# Patient Record
Sex: Female | Born: 1955
Health system: Southern US, Community
[De-identification: ages and names within clinical notes are randomized; demographics above are authoritative.]

## PROBLEM LIST (undated history)

## (undated) DIAGNOSIS — T4145XA Adverse effect of unspecified anesthetic, initial encounter: Secondary | ICD-10-CM

## (undated) DIAGNOSIS — K5909 Other constipation: Secondary | ICD-10-CM

## (undated) DIAGNOSIS — Z9889 Other specified postprocedural states: Secondary | ICD-10-CM

## (undated) DIAGNOSIS — F419 Anxiety disorder, unspecified: Secondary | ICD-10-CM

## (undated) DIAGNOSIS — M1990 Unspecified osteoarthritis, unspecified site: Secondary | ICD-10-CM

## (undated) DIAGNOSIS — T8859XA Other complications of anesthesia, initial encounter: Secondary | ICD-10-CM

## (undated) DIAGNOSIS — K635 Polyp of colon: Secondary | ICD-10-CM

## (undated) DIAGNOSIS — E785 Hyperlipidemia, unspecified: Secondary | ICD-10-CM

## (undated) DIAGNOSIS — F32A Depression, unspecified: Secondary | ICD-10-CM

## (undated) DIAGNOSIS — I10 Essential (primary) hypertension: Secondary | ICD-10-CM

## (undated) DIAGNOSIS — R112 Nausea with vomiting, unspecified: Secondary | ICD-10-CM

## (undated) DIAGNOSIS — K219 Gastro-esophageal reflux disease without esophagitis: Secondary | ICD-10-CM

## (undated) DIAGNOSIS — F329 Major depressive disorder, single episode, unspecified: Secondary | ICD-10-CM

## (undated) HISTORY — PX: BREAST SURGERY: SHX581

## (undated) HISTORY — DX: Major depressive disorder, single episode, unspecified: F32.9

## (undated) HISTORY — DX: Hyperlipidemia, unspecified: E78.5

## (undated) HISTORY — PX: HIP SURGERY: SHX245

## (undated) HISTORY — PX: RHINOPLASTY: SHX2354

## (undated) HISTORY — DX: Depression, unspecified: F32.A

## (undated) HISTORY — PX: OVARIAN CYST SURGERY: SHX726

## (undated) HISTORY — DX: Unspecified osteoarthritis, unspecified site: M19.90

## (undated) HISTORY — DX: Essential (primary) hypertension: I10

## (undated) HISTORY — DX: Other constipation: K59.09

## (undated) HISTORY — DX: Anxiety disorder, unspecified: F41.9

## (undated) HISTORY — DX: Gastro-esophageal reflux disease without esophagitis: K21.9

## (undated) HISTORY — DX: Polyp of colon: K63.5

---

## 2013-05-21 LAB — LIPID PANEL
CHOLESTEROL: 243 mg/dL — AB (ref 0–200)
HDL: 67 mg/dL (ref 35–70)
LDL CALC: 160 mg/dL
LDL/HDL RATIO: 3.6
Triglycerides: 78 mg/dL (ref 40–160)

## 2013-12-04 LAB — BASIC METABOLIC PANEL
BUN: 14 mg/dL (ref 4–21)
Creatinine: 0.7 mg/dL (ref 0.5–1.1)
Glucose: 74 mg/dL
POTASSIUM: 4.7 mmol/L (ref 3.4–5.3)
SODIUM: 138 mmol/L (ref 137–147)

## 2013-12-04 LAB — CBC AND DIFFERENTIAL
HCT: 42 % (ref 36–46)
Hemoglobin: 14.3 g/dL (ref 12.0–16.0)
Platelets: 232 10*3/uL (ref 150–399)
WBC: 3.6 10^3/mL

## 2013-12-04 LAB — LIPID PANEL
CHOLESTEROL: 258 mg/dL — AB (ref 0–200)
Cholesterol: 258 mg/dL — AB (ref 0–200)
HDL: 72 mg/dL — AB (ref 35–70)
HDL: 72 mg/dL — AB (ref 35–70)
LDL Cholesterol: 170 mg/dL
LDL Cholesterol: 170 mg/dL
Triglycerides: 81 mg/dL (ref 40–160)
Triglycerides: 81 mg/dL (ref 40–160)

## 2013-12-04 LAB — HEMOGLOBIN A1C: Hgb A1c MFr Bld: 5.5 % (ref 4.0–6.0)

## 2013-12-04 LAB — HEPATIC FUNCTION PANEL
ALK PHOS: 62 U/L (ref 25–125)
ALT: 15 U/L (ref 7–35)
AST: 22 U/L (ref 13–35)
Bilirubin, Total: 0.6 mg/dL

## 2014-03-28 ENCOUNTER — Ambulatory Visit (INDEPENDENT_AMBULATORY_CARE_PROVIDER_SITE_OTHER): Payer: 59 | Admitting: Physician Assistant

## 2014-03-28 ENCOUNTER — Encounter: Payer: Self-pay | Admitting: Physician Assistant

## 2014-03-28 VITALS — BP 121/81 | HR 83 | Temp 98.3°F | Ht 63.0 in | Wt 133.0 lb

## 2014-03-28 DIAGNOSIS — J302 Other seasonal allergic rhinitis: Secondary | ICD-10-CM

## 2014-03-28 DIAGNOSIS — F32A Depression, unspecified: Secondary | ICD-10-CM | POA: Insufficient documentation

## 2014-03-28 DIAGNOSIS — I7300 Raynaud's syndrome without gangrene: Secondary | ICD-10-CM

## 2014-03-28 DIAGNOSIS — L219 Seborrheic dermatitis, unspecified: Secondary | ICD-10-CM

## 2014-03-28 DIAGNOSIS — E785 Hyperlipidemia, unspecified: Secondary | ICD-10-CM

## 2014-03-28 DIAGNOSIS — M150 Primary generalized (osteo)arthritis: Secondary | ICD-10-CM

## 2014-03-28 DIAGNOSIS — F329 Major depressive disorder, single episode, unspecified: Secondary | ICD-10-CM

## 2014-03-28 DIAGNOSIS — M159 Polyosteoarthritis, unspecified: Secondary | ICD-10-CM

## 2014-03-28 DIAGNOSIS — F411 Generalized anxiety disorder: Secondary | ICD-10-CM

## 2014-03-28 DIAGNOSIS — I10 Essential (primary) hypertension: Secondary | ICD-10-CM

## 2014-03-28 MED ORDER — MOMETASONE FUROATE 0.1 % EX CREA: 1.0000 "application " | TOPICAL_CREAM | Freq: Every day | CUTANEOUS | Status: DC

## 2014-03-28 MED ORDER — DULOXETINE HCL 60 MG PO CPEP: 60.0000 mg | ORAL_CAPSULE | Freq: Every day | ORAL | Status: DC

## 2014-03-28 MED ORDER — AMLODIPINE BESYLATE 5 MG PO TABS: 5.0000 mg | ORAL_TABLET | Freq: Every day | ORAL | Status: DC

## 2014-03-31 NOTE — Progress Notes (Signed)
   Subjective:    Patient ID: Jillian Stewart, female    DOB: 1955-09-28, 59 y.o.   MRN: [id number]  HPI  Pt is a 59 yo female who presents to the clinic to establish care. She moved down to [place] from up [place] to be with her children and grandchildren.   .. Active Ambulatory Problems    Diagnosis Date Noted  . Generalized anxiety disorder 03/28/2014  . Depression 03/28/2014  . Degenerative joint disease involving multiple joints 03/28/2014  . Raynaud disease 03/28/2014  . Seasonal allergies 03/28/2014  . Essential hypertension, benign 03/28/2014  . Seborrheic dermatitis 03/28/2014  . Hyperlipidemia 03/28/2014   Resolved Ambulatory Problems    Diagnosis Date Noted  . No Resolved Ambulatory Problems   Past Medical History  Diagnosis Date  . Hypertension    .Marland Kitchen Family History  Problem Relation Age of Onset  . Cancer Mother   . Stroke Father    .Marland Kitchen History   Social History  . Marital Status: Married    Spouse Name: N/A    Number of Children: N/A  . Years of Education: N/A   Occupational History  . Not on file.   Social History Main Topics  . Smoking status: Former Smoker    Types: Cigarettes    Quit date: 03/13/2003  . Smokeless tobacco: Never Used  . Alcohol Use: 1.2 - 2.4 oz/week    2-4 Cans of beer per week  . Drug Use: No  . Sexual Activity: Yes   Other Topics Concern  . Not on file   Social History Narrative  . No narrative on file   No complaints today. Needs refills.     Review of Systems  All other systems reviewed and are negative.      Objective:   Physical Exam  Constitutional: She is oriented to person, place, and time. She appears well-developed and well-nourished.  HENT:  Head: Normocephalic and atraumatic.  Cardiovascular: Normal rate, regular rhythm and normal heart sounds.   Pulmonary/Chest: Effort normal and breath sounds normal.  Neurological: She is alert and oriented to person, place, and time.  Psychiatric: She has a normal mood  and affect. Her behavior is normal.          Assessment & Plan:  GAD/depression- refilled cymbalta for 6 months. PHQ-9 was 5. GAD-7 was 4.   HTN- reilled norvasc for 6 months.   Seasonal allergies- flonase refilled as needed.   seborrheic dermatitis- elocon refilled as needed.   Hyperlipidemia- recent labs last year. Borderline not able to tolerate statins.   Last cpe 06/2013.

## 2014-04-29 ENCOUNTER — Encounter: Payer: Self-pay | Admitting: Physician Assistant

## 2014-05-15 ENCOUNTER — Telehealth: Payer: Self-pay | Admitting: Physician Assistant

## 2014-05-15 ENCOUNTER — Other Ambulatory Visit: Payer: Self-pay | Admitting: Physician Assistant

## 2014-05-15 DIAGNOSIS — Z1239 Encounter for other screening for malignant neoplasm of breast: Secondary | ICD-10-CM

## 2014-05-15 NOTE — Telephone Encounter (Signed)
Mrs. Muller called. She wants referral for a mammogram. Her phone number is, [phone]. Thank you.

## 2014-05-15 NOTE — Telephone Encounter (Signed)
Please alert pt done. Call imaging if not heard from them this week.

## 2014-05-16 NOTE — Telephone Encounter (Signed)
LMOM notifying pt.

## 2014-05-25 ENCOUNTER — Encounter: Payer: Self-pay | Admitting: Physician Assistant

## 2014-06-07 ENCOUNTER — Ambulatory Visit: Payer: 59

## 2014-06-07 ENCOUNTER — Ambulatory Visit (INDEPENDENT_AMBULATORY_CARE_PROVIDER_SITE_OTHER): Payer: 59

## 2014-06-07 ENCOUNTER — Other Ambulatory Visit: Payer: Self-pay | Admitting: Physician Assistant

## 2014-06-07 DIAGNOSIS — Z1231 Encounter for screening mammogram for malignant neoplasm of breast: Secondary | ICD-10-CM | POA: Diagnosis not present

## 2014-06-07 DIAGNOSIS — Z1239 Encounter for other screening for malignant neoplasm of breast: Secondary | ICD-10-CM

## 2014-06-07 LAB — HM MAMMOGRAPHY

## 2014-06-30 ENCOUNTER — Telehealth: Payer: Self-pay | Admitting: *Deleted

## 2014-06-30 NOTE — Telephone Encounter (Signed)
Patient called stating that she had a "lonestar tick"  On her.   And wondering what maybe she needs to do?  Spoke with Luvenia Starch & advise to watch for fever,chills & rash, maybe even take a picture of the rash.  Can come in or even got urgent care or E.R.  Left VM stating the above .Marland Kitchen

## 2014-07-10 ENCOUNTER — Encounter: Payer: Self-pay | Admitting: Physician Assistant

## 2014-07-10 ENCOUNTER — Ambulatory Visit (INDEPENDENT_AMBULATORY_CARE_PROVIDER_SITE_OTHER): Payer: 59 | Admitting: Physician Assistant

## 2014-07-10 VITALS — BP 141/88 | HR 81 | Wt 135.0 lb

## 2014-07-10 DIAGNOSIS — L989 Disorder of the skin and subcutaneous tissue, unspecified: Secondary | ICD-10-CM

## 2014-07-10 DIAGNOSIS — T148 Other injury of unspecified body region: Secondary | ICD-10-CM

## 2014-07-10 DIAGNOSIS — L821 Other seborrheic keratosis: Secondary | ICD-10-CM

## 2014-07-10 DIAGNOSIS — M7551 Bursitis of right shoulder: Secondary | ICD-10-CM

## 2014-07-10 DIAGNOSIS — W57XXXA Bitten or stung by nonvenomous insect and other nonvenomous arthropods, initial encounter: Secondary | ICD-10-CM

## 2014-07-10 MED ORDER — TRIAMCINOLONE ACETONIDE 0.1 % EX CREA: 1.0000 "application " | TOPICAL_CREAM | Freq: Two times a day (BID) | CUTANEOUS | Status: DC

## 2014-07-10 MED ORDER — DOXYCYCLINE HYCLATE 100 MG PO TABS: 100.0000 mg | ORAL_TABLET | Freq: Two times a day (BID) | ORAL | Status: DC

## 2014-07-10 NOTE — Patient Instructions (Signed)
Lyme Disease You may have been bitten by a tick and are to watch for the development of Lyme Disease. Lyme Disease is an infection that is caused by a bacteria The bacteria causing this disease is named Borreilia burgdorferi. If a tick is infected with this bacteria and then bites you, then Lyme Disease may occur. These ticks are carried by deer and rodents such as rabbits and mice and infest grassy as well as forested areas. Fortunately most tick bites do not cause Lyme Disease.  Lyme Disease is easier to prevent than to treat. First, covering your legs with clothing when walking in areas where ticks are possibly abundant will prevent their attachment because ticks tend to stay within inches of the ground. Second, using insecticides containing DEET can be applied on skin or clothing. Last, because it takes about 12 to 24 hours for the tick to transmit the disease after attachment to the human host, you should inspect your body for ticks twice a day when you are in areas where Lyme Disease is common. You must look thoroughly when searching for ticks. The Ixodes tick that carries Lyme Disease is very small. It is around the size of a sesame seed (picture of tick is not actual size). Removal is best done by grasping the tick by the head and pulling it out. Do not to squeeze the body of the tick. This could inject the infecting bacteria into the bite site. Wash the area of the bite with an antiseptic solution after removal.  Lyme Disease is a disease that may affect many body systems. Because of the small size of the biting tick, most people do not notice being bitten. The first sign of an infection is usually a round red rash that extends out from the center of the tick bite. The center of the lesion may be blood colored (hemorrhagic) or have tiny blisters (vesicular). Most lesions have bright red outer borders and partial central clearing. This rash may extend out many inches in diameter, and multiple lesions may  be present. Other symptoms such as fatigue, headaches, chills and fever, general achiness and swelling of lymph glands may also occur. If this first stage of the disease is left untreated, these symptoms may gradually resolve by themselves, or progressive symptoms may occur because of spread of infection to other areas of the body.  Follow up with your caregiver to have testing and treatment if you have a tick bite and you develop any of the above complaints. Your caregiver may recommend preventative (prophylactic) medications which kill bacteria (antibiotics). Once a diagnosis of Lyme Disease is made, antibiotic treatment is highly likely to cure the disease. Effective treatment of late stage Lyme Disease may require longer courses of antibiotic therapy.  MAKE SURE YOU:   Understand these instructions.  Will watch your condition.  Will get help right away if you are not doing well or get worse. Document Released: 06/02/2000 Document Revised: 05/19/2011 Document Reviewed: 08/04/2008 [employer] Patient Information 2015 [employer]. This information is not intended to replace advice given to you by your health care provider. Make sure you discuss any questions you have with your health care provider.  

## 2014-07-11 LAB — B. BURGDORFI ANTIBODIES BY WB
B BURGDORFERI IGG ABS (IB): NEGATIVE
B burgdorferi IgM Abs (IB): NEGATIVE

## 2014-07-12 NOTE — Progress Notes (Addendum)
   Subjective:    Patient ID: Jillian Stewart, female    DOB: 1955/08/30, 59 y.o.   MRN: [id number]  HPI Pt presents to the clinic with tick bite on right upper shoulder with rash that appears like bullseye for 10 days. She does bring in the tick and is a lone star tick. She does complain of some warm and itchiness coming from right upper shoulder. No fever, chills, chills, muscle pain or generalized joint pain. At this point she is concerned about Lyme's disease.  Patient is also having some right shoulder pain. She admits to recent overuse. There is pain with overhead movements and reaching behind back. She knows she needs to rest but has not been resting. She is really not taken anything to make better. No known trauma.     Review of Systems  All other systems reviewed and are negative.      Objective:   Physical Exam  Constitutional: She is oriented to person, place, and time. She appears well-developed and well-nourished.  HENT:  Head: Normocephalic and atraumatic.  Cardiovascular: Normal rate, regular rhythm and normal heart sounds.   Pulmonary/Chest: Effort normal and breath sounds normal.  Musculoskeletal:  Right shoulder:  NROM with some pain with full abduction.  Strength 5/5 of right upper extremity.  Rotator cuff muscle strength great.  Pinpoint pain to palpation over the posterior shoulder and even some in the anterior shoulder. No pain to palpation over the before meals joint.  External rotation is painful as well as internal rotation.  Hawkins and empty can positive.  yergason and speeds negative.   Neurological: She is alert and oriented to person, place, and time.  Skin:     Bilateral arms, trunk, chest normal aging brown spots represent seborrheic keratosis and pearly spots representing some hyperplasia.  Psychiatric: She has a normal mood and affect. Her behavior is normal.          Assessment & Plan:  Tick bite/rash- to air on the safe side when started  doxycycline. There does seem to be an element of potential cellulitis as well. Will check Lyme's titers. I did give some topical triamcinolone cream to help with the itch as needed.  Right shoulder pain-I do think this represents right shoulder bursitis or possible impingement. Discussed options today. She opted to him. Discussed use of anti-inflammatories as needed such as ibuprofen. Patient was given exercises for bursitis. If not resolved and certainly follow-up. Encourage icing regularly.   Shoulder Injection Procedure Note  Pre-operative Diagnosis: right shoulder pain possible bursitis  Post-operative Diagnosis: same  Indications: pain relief  Anesthesia: ethyl chloride   Procedure Details   Verbal consent was obtained for the procedure. The shoulder was prepped with iodine and the skin was anesthetized. Using a 22 gauge needle the glenohumeral joint is injected with 9 mL 1% lidocaine and 1 mL of depo medrol 40mg  under the posterior aspect of the acromion. The injection site was cleansed with topical isopropyl alcohol and a dressing was applied.  Complications:  None; patient tolerated the procedure well.   Seborrheic keratosis and hyperplasia-patient has some normal aging skin but would like to see a dermatologist. Referral placed today.

## 2014-08-31 ENCOUNTER — Encounter: Payer: Self-pay | Admitting: *Deleted

## 2014-09-26 ENCOUNTER — Ambulatory Visit: Payer: 59 | Admitting: Family Medicine

## 2014-09-29 ENCOUNTER — Ambulatory Visit (INDEPENDENT_AMBULATORY_CARE_PROVIDER_SITE_OTHER): Payer: 59 | Admitting: Family Medicine

## 2014-09-29 ENCOUNTER — Encounter: Payer: Self-pay | Admitting: Family Medicine

## 2014-09-29 VITALS — BP 128/82 | HR 77 | Ht 63.0 in | Wt 135.0 lb

## 2014-09-29 DIAGNOSIS — Z0000 Encounter for general adult medical examination without abnormal findings: Secondary | ICD-10-CM

## 2014-09-29 DIAGNOSIS — Z0189 Encounter for other specified special examinations: Secondary | ICD-10-CM | POA: Diagnosis not present

## 2014-09-29 DIAGNOSIS — Z114 Encounter for screening for human immunodeficiency virus [HIV]: Secondary | ICD-10-CM | POA: Diagnosis not present

## 2014-09-29 DIAGNOSIS — R5383 Other fatigue: Secondary | ICD-10-CM | POA: Diagnosis not present

## 2014-09-29 MED ORDER — AMLODIPINE BESYLATE 5 MG PO TABS: 5.0000 mg | ORAL_TABLET | Freq: Every day | ORAL | Status: DC

## 2014-09-29 MED ORDER — DULOXETINE HCL 60 MG PO CPEP: 60.0000 mg | ORAL_CAPSULE | Freq: Every day | ORAL | Status: DC

## 2014-09-29 NOTE — Progress Notes (Signed)
  Subjective:     Jillian Stewart is a 59 y.o. female and is here for a comprehensive physical exam. The patient reports problems - sores on her tongue. she has been fatigued. Though, she met she hasn't been sleeping well either. She says her shoulder pain has actually been keeping her awake at night.  History   Social History  . Marital Status: Married    Spouse Name: N/A  . Number of Children: N/A  . Years of Education: N/A   Occupational History  . Not on file.   Social History Main Topics  . Smoking status: Former Smoker    Types: Cigarettes    Quit date: 03/13/2003  . Smokeless tobacco: Never Used  . Alcohol Use: 1.2 - 2.4 oz/week    2-4 Cans of beer per week  . Drug Use: No  . Sexual Activity: Yes   Other Topics Concern  . Not on file   Social History Narrative   Health Maintenance  Topic Date Due  . HIV Screening  08/31/1968  . ZOSTAVAX  08/31/2013  . PAP SMEAR  03/31/2024 (Originally 09/01/1971)  . INFLUENZA VACCINE  10/09/2014  . DEXA SCAN  10/05/2015  . MAMMOGRAM  06/06/2016  . COLONOSCOPY  08/25/2016  . TETANUS/TDAP  03/10/2021    The following portions of the patient's history were reviewed and updated as appropriate: allergies, current medications, past family history, past medical history, past social history, past surgical history and problem list.  Review of Systems A comprehensive review of systems was negative.   Objective:    BP 128/82 mmHg  Pulse 77  Ht $R'5\' 3"'hv$  (1.6 m)  Wt 135 lb (61.236 kg)  BMI 23.92 kg/m2 General appearance: alert, cooperative and appears stated age Head: Normocephalic, without obvious abnormality, atraumatic Eyes: conj clear, EOMi, PEERLA Ears: normal TM's and external ear canals both ears Nose: Nares normal. Septum midline. Mucosa normal. No drainage or sinus tenderness. Throat: lips, mucosa, and tongue normal; teeth and gums normal Neck: no adenopathy, no carotid bruit, no JVD, supple, symmetrical, trachea midline and  thyroid not enlarged, symmetric, no tenderness/mass/nodules Back: symmetric, no curvature. ROM normal. No CVA tenderness. Lungs: clear to auscultation bilaterally Breasts: normal appearance, no masses or tenderness Heart: regular rate and rhythm, S1, S2 normal, no murmur, click, rub or gallop Abdomen: soft, non-tender; bowel sounds normal; no masses,  no organomegaly Extremities: extremities normal, atraumatic, no cyanosis or edema Pulses: 2+ and symmetric Skin: Skin color, texture, turgor normal. No rashes or lesions Lymph nodes: Cervical, supraclavicular, and axillary nodes normal. Neurologic: Alert and oriented X 3, normal strength and tone. Normal symmetric reflexes. Normal coordination and gait    Assessment:    Healthy female exam.      Plan:     See After Visit Summary for Counseling Recommendations  complete physical examination Keep up a regular exercise program and make sure you are eating a healthy diet Try to eat 4 servings of dairy a day, or if you are lactose intolerant take a calcium with vitamin D daily.  Your vaccines are up to date.   Fatigue-we will do some additional blood work just relative anemia and thyroid disorder. That sounds like her poor sleep quality is most likely increasing her fatigue levels.

## 2014-10-27 ENCOUNTER — Encounter: Payer: Self-pay | Admitting: Physician Assistant

## 2014-10-27 DIAGNOSIS — Z96642 Presence of left artificial hip joint: Secondary | ICD-10-CM | POA: Insufficient documentation

## 2014-10-27 DIAGNOSIS — M1612 Unilateral primary osteoarthritis, left hip: Secondary | ICD-10-CM | POA: Insufficient documentation

## 2014-11-03 ENCOUNTER — Ambulatory Visit (INDEPENDENT_AMBULATORY_CARE_PROVIDER_SITE_OTHER): Payer: 59

## 2014-11-03 ENCOUNTER — Encounter: Payer: Self-pay | Admitting: Physician Assistant

## 2014-11-03 ENCOUNTER — Ambulatory Visit (INDEPENDENT_AMBULATORY_CARE_PROVIDER_SITE_OTHER): Payer: 59 | Admitting: Physician Assistant

## 2014-11-03 VITALS — BP 119/76 | HR 80 | Ht 63.0 in | Wt 133.0 lb

## 2014-11-03 DIAGNOSIS — R5383 Other fatigue: Secondary | ICD-10-CM

## 2014-11-03 DIAGNOSIS — K2970 Gastritis, unspecified, without bleeding: Secondary | ICD-10-CM

## 2014-11-03 DIAGNOSIS — Z23 Encounter for immunization: Secondary | ICD-10-CM

## 2014-11-03 DIAGNOSIS — M25511 Pain in right shoulder: Secondary | ICD-10-CM

## 2014-11-03 NOTE — Patient Instructions (Signed)
Tumeric for inflammation.  vimovo is to replace ibuprofen for shoulder pain.  Get cxr and blood work.  Order PHysical therapy.    Food Choices for Gastroesophageal Reflux Disease When you have gastroesophageal reflux disease (GERD), the foods you eat and your eating habits are very important. Choosing the right foods can help ease the discomfort of GERD. WHAT GENERAL GUIDELINES DO I NEED TO FOLLOW?  Choose fruits, vegetables, whole grains, low-fat dairy products, and low-fat meat, fish, and poultry.  Limit fats such as oils, salad dressings, butter, nuts, and avocado.  Keep a food diary to identify foods that cause symptoms.  Avoid foods that cause reflux. These may be different for different people.  Eat frequent small meals instead of three large meals each day.  Eat your meals slowly, in a relaxed setting.  Limit fried foods.  Cook foods using methods other than frying.  Avoid drinking alcohol.  Avoid drinking large amounts of liquids with your meals.  Avoid bending over or lying down until 2-3 hours after eating. WHAT FOODS ARE NOT RECOMMENDED? The following are some foods and drinks that may worsen your symptoms: Vegetables Tomatoes. Tomato juice. Tomato and spaghetti sauce. Chili peppers. Onion and garlic. Horseradish. Fruits Oranges, grapefruit, and lemon (fruit and juice). Meats High-fat meats, fish, and poultry. This includes hot dogs, ribs, ham, sausage, salami, and bacon. Dairy Whole milk and chocolate milk. Sour cream. Cream. Butter. Ice cream. Cream cheese.  Beverages Coffee and tea, with or without caffeine. Carbonated beverages or energy drinks. Condiments Hot sauce. Barbecue sauce.  Sweets/Desserts Chocolate and cocoa. Donuts. Peppermint and spearmint. Fats and Oils High-fat foods, including [place] fries and potato chips. Other Vinegar. Strong spices, such as black pepper, white pepper, red pepper, cayenne, curry powder, cloves, ginger, and chili  powder. The items listed above may not be a complete list of foods and beverages to avoid. Contact your dietitian for more information. Document Released: 02/24/2005 Document Revised: 03/01/2013 Document Reviewed: 12/29/2012 [facility] Patient Information 2015 [place], [place]. This information is not intended to replace advice given to you by your health care provider. Make sure you discuss any questions you have with your health care provider.

## 2014-11-03 NOTE — Progress Notes (Signed)
   Subjective:    Patient ID: Jillian Stewart, female    DOB: 14-Jun-1955, 59 y.o.   MRN: [id number]  HPI  Pt presents to the clinic with right shoulder pain that continues. Injection gave 6-7 weeks of relief. She has not done PT. She is interested in that. No new injury. Some weakness. Hurts to lay on shoulder. Feels like it catches a lot.  She is taking ibuprofen daily and noticing epiosde of upper abdominal pain with acid reflux. pepcid helps.stopped ibuprofen and episodes have gone away. No blood in stool or hematemesis.     Review of Systems  All other systems reviewed and are negative.      Objective:   Physical Exam  Constitutional: She is oriented to person, place, and time. She appears well-developed and well-nourished.  HENT:  Head: Normocephalic and atraumatic.  Cardiovascular: Normal rate, regular rhythm and normal heart sounds.   Pulmonary/Chest: Effort normal and breath sounds normal.  Abdominal: Soft. Bowel sounds are normal. She exhibits no distension and no mass. There is no tenderness. There is no rebound and no guarding.  No guarding or tenderness.   Musculoskeletal:  Right shoulder-  No swelling or pain over acrimion or clavicle.  Pain posterior below corcoid process and pushing on subacrmial bursa. Active ROM starts to catch with abduction above 90 degrees but can get to full ROM.  Pain more with internal ROM.  Negative drop arm sign.  Strength still 4/5.    Neurological: She is alert and oriented to person, place, and time.  Skin: Skin is dry.  Psychiatric: She has a normal mood and affect. Her behavior is normal.          Assessment & Plan:  Right shoulder pain-i suspect imipingement syndrome. xray ordered of shoulder which showed mild degenerative changes of the glenohumeral joint. vimovo bid to replace OTC ibuprofen. Ordered PT to start. Will consider MRI based on  PT improvement. Discussed tumeric for body inflammation.   Gastritis- likely due to NSAID  use daily. Stop ibuprofen. Samples given of Vimovo to correct. Call if still having stomach pains.  No concerning signs of exam today or in HPI.    Pt still has fasting labs to get drawn added vitamin D and ANA due to fatigue and joint pain with hx of DDD. I still think nothing sleeping due to shoulder pain could be contributing.   Flu shot given today.

## 2014-11-04 DIAGNOSIS — M25511 Pain in right shoulder: Secondary | ICD-10-CM | POA: Insufficient documentation

## 2014-11-18 LAB — LIPID PANEL
CHOLESTEROL: 277 mg/dL — AB (ref 125–200)
HDL: 73 mg/dL (ref >=46)
LDL Cholesterol: 180 mg/dL — ABNORMAL HIGH (ref <130)
TRIGLYCERIDES: 119 mg/dL (ref <150)
Total CHOL/HDL Ratio: 3.8 Ratio (ref <=5.0)
VLDL: 24 mg/dL (ref <30)

## 2014-11-18 LAB — FERRITIN: FERRITIN: 44 ng/mL (ref 10–291)

## 2014-11-18 LAB — COMPLETE METABOLIC PANEL WITH GFR
ALK PHOS: 62 U/L (ref 33–130)
ALT: 17 U/L (ref 6–29)
AST: 24 U/L (ref 10–35)
Albumin: 4.5 g/dL (ref 3.6–5.1)
BUN: 15 mg/dL (ref 7–25)
CALCIUM: 9.6 mg/dL (ref 8.6–10.4)
CO2: 28 mmol/L (ref 20–31)
CREATININE: 0.72 mg/dL (ref 0.50–0.99)
Chloride: 102 mmol/L (ref 98–110)
Glucose, Bld: 81 mg/dL (ref 65–99)
Potassium: 4.3 mmol/L (ref 3.5–5.3)
Sodium: 139 mmol/L (ref 135–146)
TOTAL PROTEIN: 7.2 g/dL (ref 6.1–8.1)
Total Bilirubin: 0.6 mg/dL (ref 0.2–1.2)

## 2014-11-18 LAB — HIV ANTIBODY (ROUTINE TESTING W REFLEX): HIV 1&2 Ab, 4th Generation: NONREACTIVE

## 2014-11-18 LAB — MAGNESIUM: MAGNESIUM: 2.2 mg/dL (ref 1.5–2.5)

## 2014-11-18 LAB — TSH: TSH: 2.585 u[IU]/mL (ref 0.350–4.500)

## 2014-11-18 LAB — VITAMIN B12: VITAMIN B 12: 862 pg/mL (ref 211–911)

## 2014-11-18 LAB — VITAMIN D 25 HYDROXY (VIT D DEFICIENCY, FRACTURES): Vit D, 25-Hydroxy: 35 ng/mL (ref 30–100)

## 2014-11-20 ENCOUNTER — Other Ambulatory Visit: Payer: Self-pay | Admitting: *Deleted

## 2014-11-20 ENCOUNTER — Encounter: Payer: Self-pay | Admitting: Physical Therapy

## 2014-11-20 ENCOUNTER — Ambulatory Visit: Payer: 59 | Attending: Physician Assistant | Admitting: Physical Therapy

## 2014-11-20 DIAGNOSIS — M25511 Pain in right shoulder: Secondary | ICD-10-CM | POA: Diagnosis present

## 2014-11-20 DIAGNOSIS — R29898 Other symptoms and signs involving the musculoskeletal system: Secondary | ICD-10-CM | POA: Insufficient documentation

## 2014-11-20 LAB — ANA: Anti Nuclear Antibody(ANA): NEGATIVE

## 2014-11-20 LAB — HIV RAPID SCREEN (BLD OR BODY FLD EXPOSURE)

## 2014-11-20 MED ORDER — NAPROXEN-ESOMEPRAZOLE 375-20 MG PO TBEC: DELAYED_RELEASE_TABLET | ORAL | Status: DC

## 2014-11-20 NOTE — Therapy (Signed)
[facility] [address]  [place], [place], [address] Phone: [phone]   Fax:  [phone]  Physical Therapy Evaluation  Patient Details  Name: Jillian Stewart MRN: [id number] Date of Birth: 1955-11-27 Referring Provider:  Donella Stade, PA-C  Encounter Date: 11/20/2014      PT End of Session - 11/20/14 1542    Visit Number 1   Number of Visits 12   Date for PT Re-Evaluation 01/01/15   PT Start Time 5053   PT Stop Time 9767   PT Time Calculation (min) 44 min      Past Medical History  Diagnosis Date  . Hyperlipidemia   . Hypertension     Past Surgical History  Procedure Laterality Date  . Rhinoplasty    . Ovarian cyst surgery    . Breast surgery    . Hip surgery      right and left.      There were no vitals filed for this visit.  Visit Diagnosis:  Pain in joint, shoulder region, right  Shoulder weakness      Subjective Assessment - 11/20/14 1544    Subjective Pt with c/o R shoulder pain for just over past year.  X-rays indicate spurring and loss of joint space/degeneration.  Has difficulty sleeping due to shoulder pain.   Pertinent History L shoulder degeneration in need of replacement.   Currently in Pain? Yes   Pain Score --  pt rates AVG pain 4-5/10 lately and was up to 8-9/10 prior to medication.   Pain Location Shoulder   Pain Orientation Right;Lateral;Upper   Pain Radiating Towards throughout shoulder and extends into UE to neck   Pain Onset More than a month ago   Pain Frequency Intermittent   Aggravating Factors  quick movements, opening car door, horiz add, reaching overhead and behind back   Pain Relieving Factors medication, heat, ice            [facility] PT Assessment - 11/20/14 0001    Assessment   Medical Diagnosis R Shoulder pain   Onset Date/Surgical Date 03/10/13   Hand Dominance Right   Balance Screen   Has the patient fallen in the past 6 months No   Has the  patient had a decrease in activity level because of a fear of falling?  No   Is the patient reluctant to leave their home because of a fear of falling?  No   Prior Function   Vocation Unemployed   Leisure enjoys knitting; walks regularly, outdoor biking   Observation/Other Assessments   Focus on Therapeutic Outcomes (FOTO)  50% limitation   ROM / Strength   AROM / PROM / Strength PROM   AROM   AROM Assessment Site Shoulder   Right/Left Shoulder Right;Left   Right Shoulder Flexion 140 Degrees   Right Shoulder ABduction 135 Degrees   Right Shoulder Internal Rotation --  Reach to T12   Right Shoulder External Rotation --  Reach to T3   Left Shoulder ABduction 115 Degrees   Left Shoulder Internal Rotation --  reach to L buttock   Left Shoulder External Rotation --  reach to T3   PROM   PROM Assessment Site Shoulder   Right/Left Shoulder Right   Right Shoulder Flexion 152 Degrees   Right Shoulder ABduction 135 Degrees   Right Shoulder Internal Rotation 45 Degrees  pain   Right Shoulder External Rotation 95 Degrees  pain  TODAY'S TREATMENT: Manual - R Shoulder grade 2 AP and caudal glides 4 strips kinesiotape to R shoulder (50% lateral delt, 30% anterior and posterior delt, 70% subacromial space)              PT Education - 11/20/14 1814    Education provided Yes   Education Details body mechanics/posture and effects on shoulder pain   Person(s) Educated Patient   Methods Explanation;Demonstration   Comprehension Verbalized understanding          PT Short Term Goals - 11/20/14 1817    PT SHORT TERM GOAL #1   Title pt independent with initial HEP by 12/01/14   Status New           PT Long Term Goals - 11/20/14 1818    PT LONG TERM GOAL #1   Title pt independent with advanced HEP as necessary by 01/01/15   Status New   PT LONG TERM GOAL #2   Title R Shoulder AROM WFL without c/o pain by 01/01/15   Status New   PT LONG TERM GOAL #3   Title pt  reports able to sleep without limitation by R shoulder pain by 01/01/15   Status New   PT LONG TERM GOAL #4   Title pt able to perform ADLs, chores, and recreational activities with minimal to no restriction by R shoulder pain by 01/01/15   Status New   PT LONG TERM GOAL #5   Title R Shoulder MMT 4+/5 or better all planes by 01/01/15   Status New               Plan - 11/20/14 1820    Clinical Impression Statement pt with 1+ year history of R shoudler pain. X-rays indicate some degeneration and spurring.  Symptoms today consistent with impingment with possible mild RC injury (supra and/or infraspin).  Special testing: NEG empty and full can, POS impingement, POS O'briens, POS resisted ER, NEG belly press.  R Shoulder PROM is WFL other than IR limited to 45 and ABD limited to 135 both due to pain.  Shoulder MMT limited to 4-/5 to 4/5 grossly due to pain.  Contributing factor to pain includes pt with rounded shoulder posture with tight pectorals and insufficient scapular stability.  Pt also with limited use of L UE due to significant L shoulder OA with pt stating she's been advised she needs shoulder replacement.   Pt will benefit from skilled therapeutic intervention in order to improve on the following deficits Pain;Decreased mobility;Decreased strength;Postural dysfunction;Improper body mechanics;Impaired UE functional use;Decreased range of motion   Rehab Potential Good   PT Frequency 2x / week   PT Duration 6 weeks   PT Treatment/Interventions Dry needling;Taping;Vasopneumatic Device;Manual techniques;Patient/family education;Ultrasound;Therapeutic activities;Therapeutic exercise;Moist Heat;Electrical Stimulation;Cryotherapy;Iontophoresis 4mg /ml Dexamethasone   PT Next Visit Plan HEP instruction; assess tape benefit; scapular stability and RC strengthening; manual and modalities for pain PRN; ionto if MD and pt agree   Consulted and Agree with Plan of Care Patient         Problem  List Patient Active Problem List   Diagnosis Date Noted  . Right shoulder pain 11/04/2014  . Degenerative joint disease of left hip 10/27/2014  . History of total left hip arthroplasty 10/27/2014  . Generalized anxiety disorder 03/28/2014  . Depression 03/28/2014  . Degenerative joint disease involving multiple joints 03/28/2014  . Raynaud disease 03/28/2014  . Seasonal allergies 03/28/2014  . Essential hypertension, benign 03/28/2014  . Seborrheic dermatitis 03/28/2014  . Hyperlipidemia  03/28/2014    Sherill Wegener PT, OCS 11/20/2014, 6:27 PM  [facility] [address]  [place], [place], [address] Phone: [phone]   Fax:  [phone]

## 2014-11-27 ENCOUNTER — Other Ambulatory Visit: Payer: Self-pay | Admitting: *Deleted

## 2014-11-27 ENCOUNTER — Ambulatory Visit: Payer: 59 | Admitting: Rehabilitation

## 2014-11-27 DIAGNOSIS — R29898 Other symptoms and signs involving the musculoskeletal system: Secondary | ICD-10-CM

## 2014-11-27 DIAGNOSIS — M25511 Pain in right shoulder: Secondary | ICD-10-CM

## 2014-11-27 MED ORDER — EZETIMIBE 10 MG PO TABS: 10.0000 mg | ORAL_TABLET | Freq: Every day | ORAL | Status: DC

## 2014-11-28 NOTE — Therapy (Signed)
[facility] [address]  [place], [place], [address] Phone: [phone]   Fax:  [phone]  Physical Therapy Treatment  Patient Details  Name: Jillian Stewart MRN: [id number] Date of Birth: 03/20/1955 Referring Provider:  Donella Stade, PA-C  Encounter Date: 11/27/2014      PT End of Session - 11/28/14 0746    Visit Number 2   Number of Visits 12   Date for PT Re-Evaluation 01/01/15   PT Start Time 6962   PT Stop Time 1530   PT Time Calculation (min) 23 min      Past Medical History  Diagnosis Date  . Hyperlipidemia   . Hypertension     Past Surgical History  Procedure Laterality Date  . Rhinoplasty    . Ovarian cyst surgery    . Breast surgery    . Hip surgery      right and left.      There were no vitals filed for this visit.  Visit Diagnosis:  Pain in joint, shoulder region, right  Shoulder weakness      Subjective Assessment - 11/28/14 0745    Subjective Pt arrived at the wrong time and was able to see her for 20-25 minutes. Reports the tape really helped and would like to learn how to do it in the future.    Currently in Pain? Yes   Pain Score 4    Pain Location Shoulder   Pain Orientation Right;Lateral;Upper          TODAY'S TREATMENT: Manual - R Shoulder grade 1-2 AP and caudal glides with gentle stretching into flexion, IR and ER Gentle distraction with oscillations to pt tolerance STM to Rt pec/anterior shoulder area due to tenderness 4 strips kinesiotape to R shoulder (50% lateral delt, 30% anterior and posterior delt, 70% subacromial space)       PT Short Term Goals - 11/28/14 0750    PT SHORT TERM GOAL #1   Title pt independent with initial HEP by 12/01/14   Status On-going           PT Long Term Goals - 11/28/14 0750    PT LONG TERM GOAL #1   Title pt independent with advanced HEP as necessary by 01/01/15   Status On-going   PT LONG TERM GOAL #2   Title R  Shoulder AROM WFL without c/o pain by 01/01/15   Status On-going   PT LONG TERM GOAL #3   Title pt reports able to sleep without limitation by R shoulder pain by 01/01/15   Status On-going   PT LONG TERM GOAL #4   Title pt able to perform ADLs, chores, and recreational activities with minimal to no restriction by R shoulder pain by 01/01/15   Status On-going   PT LONG TERM GOAL #5   Title R Shoulder MMT 4+/5 or better all planes by 01/01/15   Status On-going               Plan - 11/28/14 0747    Clinical Impression Statement Unable to fully treat patient due to time restraints so focused on manual work and reapplied tape today. Did note tenderness along Rt pec with STM and slight pain with most shoulder mobs. Will issue HEP and continue with current POC at next visit.    PT Next Visit Plan HEP instruction; assess tape benefit; scapular stability and RC strengthening; manual and modalities for pain PRN; ionto if MD and  pt agree   Consulted and Agree with Plan of Care Patient        Problem List Patient Active Problem List   Diagnosis Date Noted  . Right shoulder pain 11/04/2014  . Degenerative joint disease of left hip 10/27/2014  . History of total left hip arthroplasty 10/27/2014  . Generalized anxiety disorder 03/28/2014  . Depression 03/28/2014  . Degenerative joint disease involving multiple joints 03/28/2014  . Raynaud disease 03/28/2014  . Seasonal allergies 03/28/2014  . Essential hypertension, benign 03/28/2014  . Seborrheic dermatitis 03/28/2014  . Hyperlipidemia 03/28/2014    Barbette Hair, PTA 11/28/2014, 7:50 AM  [facility] [address]  [place], [place], [address] Phone: [phone]   Fax:  [phone]

## 2014-11-29 ENCOUNTER — Ambulatory Visit: Payer: 59 | Admitting: Physical Therapy

## 2014-11-30 ENCOUNTER — Ambulatory Visit: Payer: 59 | Admitting: Rehabilitation

## 2014-11-30 ENCOUNTER — Encounter: Payer: Self-pay | Admitting: Rehabilitation

## 2014-11-30 DIAGNOSIS — M25511 Pain in right shoulder: Secondary | ICD-10-CM | POA: Diagnosis not present

## 2014-11-30 DIAGNOSIS — R29898 Other symptoms and signs involving the musculoskeletal system: Secondary | ICD-10-CM

## 2014-11-30 NOTE — Therapy (Signed)
[facility] [address]  [place], [place], [address] Phone: [phone]   Fax:  [phone]  Physical Therapy Treatment  Patient Details  Name: Jillian Stewart MRN: [id number] Date of Birth: [birth year] Referring Jayzon Taras:  Donella Stade, PA-C  Encounter Date: 11/30/2014      PT End of Session - 11/30/14 1659    Visit Number 3   Number of Visits 12   Date for PT Re-Evaluation 01/01/15   PT Start Time 2878   PT Stop Time 6767   PT Time Calculation (min) 44 min   Activity Tolerance Patient tolerated treatment well      Past Medical History  Diagnosis Date  . Hyperlipidemia   . Hypertension     Past Surgical History  Procedure Laterality Date  . Rhinoplasty    . Ovarian cyst surgery    . Breast surgery    . Hip surgery      right and left.      There were no vitals filed for this visit.  Visit Diagnosis:  Pain in joint, shoulder region, right  Shoulder weakness      Subjective Assessment - 11/30/14 1616    Subjective had to take anti-inflammatory today becuase of "miserable" pain and a hard time lifting up the arm lately.     Currently in Pain? No/denies   Pain Score --  up to 5/10 with the movements   Pain Location Shoulder   Pain Orientation Right      TODAY'S TREATMENT: HEP instruction:  Doorway stretch 3x30" with vcs/demo  Row x 15 red band  Bil Shoulder extension red band x 15  ER R red band x 15  All with sig vcs and demo for completion   4 strips kinesiotape to R shoulder (50% lateral delt, 30% anterior and posterior delt, 70% subacromial space)  Corner scapular lift away x 15 bil  Supine manual pectoralis stretch R 3x30" Negative ULTT all positions today due to reports of occasional tingling                           PT Education - 11/30/14 1658    Education provided Yes   Education Details HEP   Person(s) Educated Patient   Methods  Explanation;Handout;Demonstration   Comprehension Verbalized understanding;Returned demonstration          PT Short Term Goals - 11/28/14 0750    PT SHORT TERM GOAL #1   Title pt independent with initial HEP by 12/01/14   Status On-going           PT Long Term Goals - 11/28/14 0750    PT LONG TERM GOAL #1   Title pt independent with advanced HEP as necessary by 01/01/15   Status On-going   PT LONG TERM GOAL #2   Title R Shoulder AROM WFL without c/o pain by 01/01/15   Status On-going   PT LONG TERM GOAL #3   Title pt reports able to sleep without limitation by R shoulder pain by 01/01/15   Status On-going   PT LONG TERM GOAL #4   Title pt able to perform ADLs, chores, and recreational activities with minimal to no restriction by R shoulder pain by 01/01/15   Status On-going   PT LONG TERM GOAL #5   Title R Shoulder MMT 4+/5 or better all planes by 01/01/15   Status On-going  Plan - 11/30/14 1659    Clinical Impression Statement Pt tolerated well.  no ttp at the shoulder today except subscapularis insertion but mild.  no increased pain with new TE   PT Next Visit Plan scapular stability and RC strengthening; manual and modalities for pain PRN; ionto if MD and pt agree        Problem List Patient Active Problem List   Diagnosis Date Noted  . Right shoulder pain 11/04/2014  . Degenerative joint disease of left hip 10/27/2014  . History of total left hip arthroplasty 10/27/2014  . Generalized anxiety disorder 03/28/2014  . Depression 03/28/2014  . Degenerative joint disease involving multiple joints 03/28/2014  . Raynaud disease 03/28/2014  . Seasonal allergies 03/28/2014  . Essential hypertension, benign 03/28/2014  . Seborrheic dermatitis 03/28/2014  . Hyperlipidemia 03/28/2014    Stark Bray, DPT, CMP 11/30/2014, 5:01 PM  [facility] [address]  [place], [place],  [address] Phone: [phone]   Fax:  [phone]

## 2014-11-30 NOTE — Patient Instructions (Signed)
HEP instruction:  Doorway stretch 3x30" with vcs/demo  Row x 15 red band  Bil Shoulder extension red band x 15  ER R red band x 15

## 2014-12-05 ENCOUNTER — Ambulatory Visit: Payer: 59 | Admitting: Rehabilitation

## 2014-12-05 DIAGNOSIS — M25511 Pain in right shoulder: Secondary | ICD-10-CM | POA: Diagnosis not present

## 2014-12-05 DIAGNOSIS — R29898 Other symptoms and signs involving the musculoskeletal system: Secondary | ICD-10-CM

## 2014-12-05 NOTE — Therapy (Signed)
[facility] [address]  [place], [place], [address] Phone: [phone]   Fax:  [phone]  Physical Therapy Treatment  Patient Details  Name: Jillian Stewart MRN: [id number] Date of Birth: 1955-10-04 Referring Provider:  Donella Stade, PA-C  Encounter Date: 12/05/2014      PT End of Session - 12/05/14 1353    Visit Number 4   Number of Visits 12   Date for PT Re-Evaluation 01/01/15   PT Start Time 5003   PT Stop Time 1439   PT Time Calculation (min) 44 min   Activity Tolerance Patient tolerated treatment well   Behavior During Therapy [facility] for tasks assessed/performed      Past Medical History  Diagnosis Date  . Hyperlipidemia   . Hypertension     Past Surgical History  Procedure Laterality Date  . Rhinoplasty    . Ovarian cyst surgery    . Breast surgery    . Hip surgery      right and left.      There were no vitals filed for this visit.  Visit Diagnosis:  Pain in joint, shoulder region, right  Shoulder weakness      Subjective Assessment - 12/05/14 1357    Subjective Reports her shoulder was flared up for days after completing her HEP. Says it feels like a tooth ache. Has had to take antinflammatory for days.    Currently in Pain? Yes   Pain Score 6    Pain Location Shoulder   Pain Orientation Right      TODAY'S TREATMENT: Manual - R Shoulder grade 1-2 AP and caudal glides with gentle stretching into flexion, IR and ER Gentle distraction with oscillations to pt tolerance STM to Rt pec/anterior/lateral shoulder area due to tenderness  Ultrasound - 33mHz, 20%, 1.0 w/cm2, x8' to lateral shoulder  TherEx - Supine Pullover 2# x10 Doorway Stretch with hands low 3x20" Low Row with Red TB x10  Iontophoresis 80 mAmp/min with 1.0 ml dexamethason, 6 hour patch.       PT Short Term Goals - 11/28/14 0750    PT SHORT TERM GOAL #1   Title pt independent with initial HEP by 12/01/14   Status  On-going           PT Long Term Goals - 11/28/14 0750    PT LONG TERM GOAL #1   Title pt independent with advanced HEP as necessary by 01/01/15   Status On-going   PT LONG TERM GOAL #2   Title R Shoulder AROM WFL without c/o pain by 01/01/15   Status On-going   PT LONG TERM GOAL #3   Title pt reports able to sleep without limitation by R shoulder pain by 01/01/15   Status On-going   PT LONG TERM GOAL #4   Title pt able to perform ADLs, chores, and recreational activities with minimal to no restriction by R shoulder pain by 01/01/15   Status On-going   PT LONG TERM GOAL #5   Title R Shoulder MMT 4+/5 or better all planes by 01/01/15   Status On-going               Plan - 12/05/14 1442    Clinical Impression Statement Focused on manual work today due to pt's reaction to HEP and exercises at last visit. Also incorporated ultrasound and ionto patch today. Pt educated on use of both modalities and side-effects of iontophoresis. Good tolerance to all exercises and  manual work with most tenderness noted at lateral deltoid.    PT Next Visit Plan scapular stability and RC strengthening; manual and modalities for pain PRN   Consulted and Agree with Plan of Care Patient        Problem List Patient Active Problem List   Diagnosis Date Noted  . Right shoulder pain 11/04/2014  . Degenerative joint disease of left hip 10/27/2014  . History of total left hip arthroplasty 10/27/2014  . Generalized anxiety disorder 03/28/2014  . Depression 03/28/2014  . Degenerative joint disease involving multiple joints 03/28/2014  . Raynaud disease 03/28/2014  . Seasonal allergies 03/28/2014  . Essential hypertension, benign 03/28/2014  . Seborrheic dermatitis 03/28/2014  . Hyperlipidemia 03/28/2014    Barbette Hair, PTA 12/05/2014, 2:46 PM  [facility] [address]  [place], [place], [address] Phone: [phone]   Fax:   [phone]

## 2014-12-06 ENCOUNTER — Encounter: Payer: Self-pay | Admitting: Family Medicine

## 2014-12-06 ENCOUNTER — Ambulatory Visit: Payer: 59 | Admitting: Rehabilitation

## 2014-12-06 ENCOUNTER — Ambulatory Visit (INDEPENDENT_AMBULATORY_CARE_PROVIDER_SITE_OTHER): Payer: 59 | Admitting: Family Medicine

## 2014-12-06 VITALS — BP 140/81 | HR 78 | Resp 16 | Ht 63.0 in | Wt 135.0 lb

## 2014-12-06 DIAGNOSIS — Z1151 Encounter for screening for human papillomavirus (HPV): Secondary | ICD-10-CM | POA: Diagnosis not present

## 2014-12-06 DIAGNOSIS — Z01419 Encounter for gynecological examination (general) (routine) without abnormal findings: Secondary | ICD-10-CM | POA: Diagnosis not present

## 2014-12-06 DIAGNOSIS — Z124 Encounter for screening for malignant neoplasm of cervix: Secondary | ICD-10-CM | POA: Diagnosis not present

## 2014-12-06 NOTE — Patient Instructions (Signed)
Preventive Care for Adults A healthy lifestyle and preventive care can promote health and wellness. Preventive health guidelines for women include the following key practices.  A routine yearly physical is a good way to check with your health care provider about your health and preventive screening. It is a chance to share any concerns and updates on your health and to receive a thorough exam.  Visit your dentist for a routine exam and preventive care every 6 months. Brush your teeth twice a day and floss once a day. Good oral hygiene prevents tooth decay and gum disease.  The frequency of eye exams is based on your age, health, family medical history, use of contact lenses, and other factors. Follow your health care provider's recommendations for frequency of eye exams.  Eat a healthy diet. Foods like vegetables, fruits, whole grains, low-fat dairy products, and lean protein foods contain the nutrients you need without too many calories. Decrease your intake of foods high in solid fats, added sugars, and salt. Eat the right amount of calories for you.Get information about a proper diet from your health care provider, if necessary.  Regular physical exercise is one of the most important things you can do for your health. Most adults should get at least 150 minutes of moderate-intensity exercise (any activity that increases your heart rate and causes you to sweat) each week. In addition, most adults need muscle-strengthening exercises on 2 or more days a week.  Maintain a healthy weight. The body mass index (BMI) is a screening tool to identify possible weight problems. It provides an estimate of body fat based on height and weight. Your health care provider can find your BMI and can help you achieve or maintain a healthy weight.For adults 20 years and older:  A BMI below 18.5 is considered underweight.  A BMI of 18.5 to 24.9 is normal.  A BMI of 25 to 29.9 is considered overweight.  A BMI of  30 and above is considered obese.  Maintain normal blood lipids and cholesterol levels by exercising and minimizing your intake of saturated fat. Eat a balanced diet with plenty of fruit and vegetables. Blood tests for lipids and cholesterol should begin at age 76 and be repeated every 5 years. If your lipid or cholesterol levels are high, you are over 50, or you are at high risk for heart disease, you may need your cholesterol levels checked more frequently.Ongoing high lipid and cholesterol levels should be treated with medicines if diet and exercise are not working.  If you smoke, find out from your health care provider how to quit. If you do not use tobacco, do not start.  Lung cancer screening is recommended for adults aged 22-80 years who are at high risk for developing lung cancer because of a history of smoking. A yearly low-dose CT scan of the lungs is recommended for people who have at least a 30-pack-year history of smoking and are a current smoker or have quit within the past 15 years. A pack year of smoking is smoking an average of 1 pack of cigarettes a day for 1 year (for example: 1 pack a day for 30 years or 2 packs a day for 15 years). Yearly screening should continue until the smoker has stopped smoking for at least 15 years. Yearly screening should be stopped for people who develop a health problem that would prevent them from having lung cancer treatment.  If you are pregnant, do not drink alcohol. If you are breastfeeding,  be very cautious about drinking alcohol. If you are not pregnant and choose to drink alcohol, do not have more than 1 drink per day. One drink is considered to be 12 ounces (355 mL) of beer, 5 ounces (148 mL) of wine, or 1.5 ounces (44 mL) of liquor.  Avoid use of street drugs. Do not share needles with anyone. Ask for help if you need support or instructions about stopping the use of drugs.  High blood pressure causes heart disease and increases the risk of  stroke. Your blood pressure should be checked at least every 1 to 2 years. Ongoing high blood pressure should be treated with medicines if weight loss and exercise do not work.  If you are 75-52 years old, ask your health care provider if you should take aspirin to prevent strokes.  Diabetes screening involves taking a blood sample to check your fasting blood sugar level. This should be done once every 3 years, after age 15, if you are within normal weight and without risk factors for diabetes. Testing should be considered at a younger age or be carried out more frequently if you are overweight and have at least 1 risk factor for diabetes.  Breast cancer screening is essential preventive care for women. You should practice "breast self-awareness." This means understanding the normal appearance and feel of your breasts and may include breast self-examination. Any changes detected, no matter how small, should be reported to a health care provider. Women in their 58s and 30s should have a clinical breast exam (CBE) by a health care provider as part of a regular health exam every 1 to 3 years. After age 16, women should have a CBE every year. Starting at age 53, women should consider having a mammogram (breast X-ray test) every year. Women who have a family history of breast cancer should talk to their health care provider about genetic screening. Women at a high risk of breast cancer should talk to their health care providers about having an MRI and a mammogram every year.  Breast cancer gene (BRCA)-related cancer risk assessment is recommended for women who have family members with BRCA-related cancers. BRCA-related cancers include breast, ovarian, tubal, and peritoneal cancers. Having family members with these cancers may be associated with an increased risk for harmful changes (mutations) in the breast cancer genes BRCA1 and BRCA2. Results of the assessment will determine the need for genetic counseling and  BRCA1 and BRCA2 testing.  Routine pelvic exams to screen for cancer are no longer recommended for nonpregnant women who are considered low risk for cancer of the pelvic organs (ovaries, uterus, and vagina) and who do not have symptoms. Ask your health care provider if a screening pelvic exam is right for you.  If you have had past treatment for cervical cancer or a condition that could lead to cancer, you need Pap tests and screening for cancer for at least 20 years after your treatment. If Pap tests have been discontinued, your risk factors (such as having a new sexual partner) need to be reassessed to determine if screening should be resumed. Some women have medical problems that increase the chance of getting cervical cancer. In these cases, your health care provider may recommend more frequent screening and Pap tests.  The HPV test is an additional test that may be used for cervical cancer screening. The HPV test looks for the virus that can cause the cell changes on the cervix. The cells collected during the Pap test can be  tested for HPV. The HPV test could be used to screen women aged 30 years and older, and should be used in women of any age who have unclear Pap test results. After the age of 30, women should have HPV testing at the same frequency as a Pap test.  Colorectal cancer can be detected and often prevented. Most routine colorectal cancer screening begins at the age of 50 years and continues through age 75 years. However, your health care provider may recommend screening at an earlier age if you have risk factors for colon cancer. On a yearly basis, your health care provider may provide home test kits to check for hidden blood in the stool. Use of a small camera at the end of a tube, to directly examine the colon (sigmoidoscopy or colonoscopy), can detect the earliest forms of colorectal cancer. Talk to your health care provider about this at age 50, when routine screening begins. Direct  exam of the colon should be repeated every 5-10 years through age 75 years, unless early forms of pre-cancerous polyps or small growths are found.  People who are at an increased risk for hepatitis B should be screened for this virus. You are considered at high risk for hepatitis B if:  You were born in a country where hepatitis B occurs often. Talk with your health care provider about which countries are considered high risk.  Your parents were born in a high-risk country and you have not received a shot to protect against hepatitis B (hepatitis B vaccine).  You have HIV or AIDS.  You use needles to inject street drugs.  You live with, or have sex with, someone who has hepatitis B.  You get hemodialysis treatment.  You take certain medicines for conditions like cancer, organ transplantation, and autoimmune conditions.  Hepatitis C blood testing is recommended for all people born from 1945 through 1965 and any individual with known risks for hepatitis C.  Practice safe sex. Use condoms and avoid high-risk sexual practices to reduce the spread of sexually transmitted infections (STIs). STIs include gonorrhea, chlamydia, syphilis, trichomonas, herpes, HPV, and human immunodeficiency virus (HIV). Herpes, HIV, and HPV are viral illnesses that have no cure. They can result in disability, cancer, and death.  You should be screened for sexually transmitted illnesses (STIs) including gonorrhea and chlamydia if:  You are sexually active and are younger than 24 years.  You are older than 24 years and your health care provider tells you that you are at risk for this type of infection.  Your sexual activity has changed since you were last screened and you are at an increased risk for chlamydia or gonorrhea. Ask your health care provider if you are at risk.  If you are at risk of being infected with HIV, it is recommended that you take a prescription medicine daily to prevent HIV infection. This is  called preexposure prophylaxis (PrEP). You are considered at risk if:  You are a heterosexual woman, are sexually active, and are at increased risk for HIV infection.  You take drugs by injection.  You are sexually active with a partner who has HIV.  Talk with your health care provider about whether you are at high risk of being infected with HIV. If you choose to begin PrEP, you should first be tested for HIV. You should then be tested every 3 months for as long as you are taking PrEP.  Osteoporosis is a disease in which the bones lose minerals and strength   with aging. This can result in serious bone fractures or breaks. The risk of osteoporosis can be identified using a bone density scan. Women ages 65 years and over and women at risk for fractures or osteoporosis should discuss screening with their health care providers. Ask your health care provider whether you should take a calcium supplement or vitamin D to reduce the rate of osteoporosis.  Menopause can be associated with physical symptoms and risks. Hormone replacement therapy is available to decrease symptoms and risks. You should talk to your health care provider about whether hormone replacement therapy is right for you.  Use sunscreen. Apply sunscreen liberally and repeatedly throughout the day. You should seek shade when your shadow is shorter than you. Protect yourself by wearing long sleeves, pants, a wide-brimmed hat, and sunglasses year round, whenever you are outdoors.  Once a month, do a whole body skin exam, using a mirror to look at the skin on your back. Tell your health care provider of new moles, moles that have irregular borders, moles that are larger than a pencil eraser, or moles that have changed in shape or color.  Stay current with required vaccines (immunizations).  Influenza vaccine. All adults should be immunized every year.  Tetanus, diphtheria, and acellular pertussis (Td, Tdap) vaccine. Pregnant women should  receive 1 dose of Tdap vaccine during each pregnancy. The dose should be obtained regardless of the length of time since the last dose. Immunization is preferred during the 27th-36th week of gestation. An adult who has not previously received Tdap or who does not know her vaccine status should receive 1 dose of Tdap. This initial dose should be followed by tetanus and diphtheria toxoids (Td) booster doses every 10 years. Adults with an unknown or incomplete history of completing a 3-dose immunization series with Td-containing vaccines should begin or complete a primary immunization series including a Tdap dose. Adults should receive a Td booster every 10 years.  Varicella vaccine. An adult without evidence of immunity to varicella should receive 2 doses or a second dose if she has previously received 1 dose. Pregnant females who do not have evidence of immunity should receive the first dose after pregnancy. This first dose should be obtained before leaving the health care facility. The second dose should be obtained 4-8 weeks after the first dose.  Human papillomavirus (HPV) vaccine. Females aged 13-26 years who have not received the vaccine previously should obtain the 3-dose series. The vaccine is not recommended for use in pregnant females. However, pregnancy testing is not needed before receiving a dose. If a female is found to be pregnant after receiving a dose, no treatment is needed. In that case, the remaining doses should be delayed until after the pregnancy. Immunization is recommended for any person with an immunocompromised condition through the age of 26 years if she did not get any or all doses earlier. During the 3-dose series, the second dose should be obtained 4-8 weeks after the first dose. The third dose should be obtained 24 weeks after the first dose and 16 weeks after the second dose.  Zoster vaccine. One dose is recommended for adults aged 60 years or older unless certain conditions are  present.  Measles, mumps, and rubella (MMR) vaccine. Adults born before 1957 generally are considered immune to measles and mumps. Adults born in 1957 or later should have 1 or more doses of MMR vaccine unless there is a contraindication to the vaccine or there is laboratory evidence of immunity to   each of the three diseases. A routine second dose of MMR vaccine should be obtained at least 28 days after the first dose for students attending postsecondary schools, health care workers, or international travelers. People who received inactivated measles vaccine or an unknown type of measles vaccine during 1963-1967 should receive 2 doses of MMR vaccine. People who received inactivated mumps vaccine or an unknown type of mumps vaccine before 1979 and are at high risk for mumps infection should consider immunization with 2 doses of MMR vaccine. For females of childbearing age, rubella immunity should be determined. If there is no evidence of immunity, females who are not pregnant should be vaccinated. If there is no evidence of immunity, females who are pregnant should delay immunization until after pregnancy. Unvaccinated health care workers born before 1957 who lack laboratory evidence of measles, mumps, or rubella immunity or laboratory confirmation of disease should consider measles and mumps immunization with 2 doses of MMR vaccine or rubella immunization with 1 dose of MMR vaccine.  Pneumococcal 13-valent conjugate (PCV13) vaccine. When indicated, a person who is uncertain of her immunization history and has no record of immunization should receive the PCV13 vaccine. An adult aged 19 years or older who has certain medical conditions and has not been previously immunized should receive 1 dose of PCV13 vaccine. This PCV13 should be followed with a dose of pneumococcal polysaccharide (PPSV23) vaccine. The PPSV23 vaccine dose should be obtained at least 8 weeks after the dose of PCV13 vaccine. An adult aged 19  years or older who has certain medical conditions and previously received 1 or more doses of PPSV23 vaccine should receive 1 dose of PCV13. The PCV13 vaccine dose should be obtained 1 or more years after the last PPSV23 vaccine dose.  Pneumococcal polysaccharide (PPSV23) vaccine. When PCV13 is also indicated, PCV13 should be obtained first. All adults aged 65 years and older should be immunized. An adult younger than age 65 years who has certain medical conditions should be immunized. Any person who resides in a nursing home or long-term care facility should be immunized. An adult smoker should be immunized. People with an immunocompromised condition and certain other conditions should receive both PCV13 and PPSV23 vaccines. People with human immunodeficiency virus (HIV) infection should be immunized as soon as possible after diagnosis. Immunization during chemotherapy or radiation therapy should be avoided. Routine use of PPSV23 vaccine is not recommended for American Indians, [place] Natives, or people younger than 65 years unless there are medical conditions that require PPSV23 vaccine. When indicated, people who have unknown immunization and have no record of immunization should receive PPSV23 vaccine. One-time revaccination 5 years after the first dose of PPSV23 is recommended for people aged 19-64 years who have chronic kidney failure, nephrotic syndrome, asplenia, or immunocompromised conditions. People who received 1-2 doses of PPSV23 before age 65 years should receive another dose of PPSV23 vaccine at age 65 years or later if at least 5 years have passed since the previous dose. Doses of PPSV23 are not needed for people immunized with PPSV23 at or after age 65 years.  Meningococcal vaccine. Adults with asplenia or persistent complement component deficiencies should receive 2 doses of quadrivalent meningococcal conjugate (MenACWY-D) vaccine. The doses should be obtained at least 2 months apart.  Microbiologists working with certain meningococcal bacteria, military recruits, people at risk during an outbreak, and people who travel to or live in countries with a high rate of meningitis should be immunized. A first-year college student up through age   21 years who is living in a residence hall should receive a dose if she did not receive a dose on or after her 16th birthday. Adults who have certain high-risk conditions should receive one or more doses of vaccine.  Hepatitis A vaccine. Adults who wish to be protected from this disease, have certain high-risk conditions, work with hepatitis A-infected animals, work in hepatitis A research labs, or travel to or work in countries with a high rate of hepatitis A should be immunized. Adults who were previously unvaccinated and who anticipate close contact with an international adoptee during the first 60 days after arrival in the [place] from a country with a high rate of hepatitis A should be immunized.  Hepatitis B vaccine. Adults who wish to be protected from this disease, have certain high-risk conditions, may be exposed to blood or other infectious body fluids, are household contacts or sex partners of hepatitis B positive people, are clients or workers in certain care facilities, or travel to or work in countries with a high rate of hepatitis B should be immunized.  Haemophilus influenzae type b (Hib) vaccine. A previously unvaccinated person with asplenia or sickle cell disease or having a scheduled splenectomy should receive 1 dose of Hib vaccine. Regardless of previous immunization, a recipient of a hematopoietic stem cell transplant should receive a 3-dose series 6-12 months after her successful transplant. Hib vaccine is not recommended for adults with HIV infection. Preventive Services / Frequency Ages 64 to 68 years  Blood pressure check.** / Every 1 to 2 years.  Lipid and cholesterol check.** / Every 5 years beginning at age  22.  Clinical breast exam.** / Every 3 years for women in their 88s and 53s.  BRCA-related cancer risk assessment.** / For women who have family members with a BRCA-related cancer (breast, ovarian, tubal, or peritoneal cancers).  Pap test.** / Every 2 years from ages 90 through 51. Every 3 years starting at age 21 through age 56 or 3 with a history of 3 consecutive normal Pap tests.  HPV screening.** / Every 3 years from ages 24 through ages 1 to 46 with a history of 3 consecutive normal Pap tests.  Hepatitis C blood test.** / For any individual with known risks for hepatitis C.  Skin self-exam. / Monthly.  Influenza vaccine. / Every year.  Tetanus, diphtheria, and acellular pertussis (Tdap, Td) vaccine.** / Consult your health care provider. Pregnant women should receive 1 dose of Tdap vaccine during each pregnancy. 1 dose of Td every 10 years.  Varicella vaccine.** / Consult your health care provider. Pregnant females who do not have evidence of immunity should receive the first dose after pregnancy.  HPV vaccine. / 3 doses over 6 months, if 72 and younger. The vaccine is not recommended for use in pregnant females. However, pregnancy testing is not needed before receiving a dose.  Measles, mumps, rubella (MMR) vaccine.** / You need at least 1 dose of MMR if you were born in 1957 or later. You may also need a 2nd dose. For females of childbearing age, rubella immunity should be determined. If there is no evidence of immunity, females who are not pregnant should be vaccinated. If there is no evidence of immunity, females who are pregnant should delay immunization until after pregnancy.  Pneumococcal 13-valent conjugate (PCV13) vaccine.** / Consult your health care provider.  Pneumococcal polysaccharide (PPSV23) vaccine.** / 1 to 2 doses if you smoke cigarettes or if you have certain conditions.  Meningococcal vaccine.** /  1 dose if you are age 19 to 21 years and a first-year college  student living in a residence hall, or have one of several medical conditions, you need to get vaccinated against meningococcal disease. You may also need additional booster doses.  Hepatitis A vaccine.** / Consult your health care provider.  Hepatitis B vaccine.** / Consult your health care provider.  Haemophilus influenzae type b (Hib) vaccine.** / Consult your health care provider. Ages 40 to 64 years  Blood pressure check.** / Every 1 to 2 years.  Lipid and cholesterol check.** / Every 5 years beginning at age 20 years.  Lung cancer screening. / Every year if you are aged 55-80 years and have a 30-pack-year history of smoking and currently smoke or have quit within the past 15 years. Yearly screening is stopped once you have quit smoking for at least 15 years or develop a health problem that would prevent you from having lung cancer treatment.  Clinical breast exam.** / Every year after age 40 years.  BRCA-related cancer risk assessment.** / For women who have family members with a BRCA-related cancer (breast, ovarian, tubal, or peritoneal cancers).  Mammogram.** / Every year beginning at age 40 years and continuing for as long as you are in good health. Consult with your health care provider.  Pap test.** / Every 3 years starting at age 30 years through age 65 or 70 years with a history of 3 consecutive normal Pap tests.  HPV screening.** / Every 3 years from ages 30 years through ages 65 to 70 years with a history of 3 consecutive normal Pap tests.  Fecal occult blood test (FOBT) of stool. / Every year beginning at age 50 years and continuing until age 75 years. You may not need to do this test if you get a colonoscopy every 10 years.  Flexible sigmoidoscopy or colonoscopy.** / Every 5 years for a flexible sigmoidoscopy or every 10 years for a colonoscopy beginning at age 50 years and continuing until age 75 years.  Hepatitis C blood test.** / For all people born from 1945 through  1965 and any individual with known risks for hepatitis C.  Skin self-exam. / Monthly.  Influenza vaccine. / Every year.  Tetanus, diphtheria, and acellular pertussis (Tdap/Td) vaccine.** / Consult your health care provider. Pregnant women should receive 1 dose of Tdap vaccine during each pregnancy. 1 dose of Td every 10 years.  Varicella vaccine.** / Consult your health care provider. Pregnant females who do not have evidence of immunity should receive the first dose after pregnancy.  Zoster vaccine.** / 1 dose for adults aged 60 years or older.  Measles, mumps, rubella (MMR) vaccine.** / You need at least 1 dose of MMR if you were born in 1957 or later. You may also need a 2nd dose. For females of childbearing age, rubella immunity should be determined. If there is no evidence of immunity, females who are not pregnant should be vaccinated. If there is no evidence of immunity, females who are pregnant should delay immunization until after pregnancy.  Pneumococcal 13-valent conjugate (PCV13) vaccine.** / Consult your health care provider.  Pneumococcal polysaccharide (PPSV23) vaccine.** / 1 to 2 doses if you smoke cigarettes or if you have certain conditions.  Meningococcal vaccine.** / Consult your health care provider.  Hepatitis A vaccine.** / Consult your health care provider.  Hepatitis B vaccine.** / Consult your health care provider.  Haemophilus influenzae type b (Hib) vaccine.** / Consult your health care provider. Ages 65   years and over  Blood pressure check.** / Every 1 to 2 years.  Lipid and cholesterol check.** / Every 5 years beginning at age 22 years.  Lung cancer screening. / Every year if you are aged 73-80 years and have a 30-pack-year history of smoking and currently smoke or have quit within the past 15 years. Yearly screening is stopped once you have quit smoking for at least 15 years or develop a health problem that would prevent you from having lung cancer  treatment.  Clinical breast exam.** / Every year after age 4 years.  BRCA-related cancer risk assessment.** / For women who have family members with a BRCA-related cancer (breast, ovarian, tubal, or peritoneal cancers).  Mammogram.** / Every year beginning at age 40 years and continuing for as long as you are in good health. Consult with your health care provider.  Pap test.** / Every 3 years starting at age 9 years through age 34 or 91 years with 3 consecutive normal Pap tests. Testing can be stopped between 65 and 70 years with 3 consecutive normal Pap tests and no abnormal Pap or HPV tests in the past 10 years.  HPV screening.** / Every 3 years from ages 57 years through ages 64 or 45 years with a history of 3 consecutive normal Pap tests. Testing can be stopped between 65 and 70 years with 3 consecutive normal Pap tests and no abnormal Pap or HPV tests in the past 10 years.  Fecal occult blood test (FOBT) of stool. / Every year beginning at age 15 years and continuing until age 17 years. You may not need to do this test if you get a colonoscopy every 10 years.  Flexible sigmoidoscopy or colonoscopy.** / Every 5 years for a flexible sigmoidoscopy or every 10 years for a colonoscopy beginning at age 86 years and continuing until age 71 years.  Hepatitis C blood test.** / For all people born from [birth year] through 1967 and any individual with known risks for hepatitis C.  Osteoporosis screening.** / A one-time screening for women ages 83 years and over and women at risk for fractures or osteoporosis.  Skin self-exam. / Monthly.  Influenza vaccine. / Every year.  Tetanus, diphtheria, and acellular pertussis (Tdap/Td) vaccine.** / 1 dose of Td every 10 years.  Varicella vaccine.** / Consult your health care provider.  Zoster vaccine.** / 1 dose for adults aged 61 years or older.  Pneumococcal 13-valent conjugate (PCV13) vaccine.** / Consult your health care provider.  Pneumococcal  polysaccharide (PPSV23) vaccine.** / 1 dose for all adults aged 28 years and older.  Meningococcal vaccine.** / Consult your health care provider.  Hepatitis A vaccine.** / Consult your health care provider.  Hepatitis B vaccine.** / Consult your health care provider.  Haemophilus influenzae type b (Hib) vaccine.** / Consult your health care provider. ** Family history and personal history of risk and conditions may change your health care provider's recommendations. Document Released: 04/22/2001 Document Revised: 07/11/2013 Document Reviewed: 07/22/2010 [facility] Patient Information 2015 [place], [place]. This information is not intended to replace advice given to you by your health care provider. Make sure you discuss any questions you have with your health care provider.

## 2014-12-06 NOTE — Progress Notes (Signed)
  Subjective:     Jillian Stewart is a 59 y.o. female and is here for a comprehensive physical exam. The patient reports no problems.  Social History   Social History  . Marital Status: Married    Spouse Name: N/A  . Number of Children: 3   . Years of Education: N/A   Occupational History  . Not on file.   Social History Main Topics  . Smoking status: Former Smoker    Types: Cigarettes    Quit date: 03/13/2003  . Smokeless tobacco: Never Used  . Alcohol Use: 0.6 - 1.2 oz/week    1-2 Cans of beer per week  . Drug Use: No  . Sexual Activity: Yes   Other Topics Concern  . Not on file   Social History Narrative   Walks for exercise daily. 3 daughters.     Health Maintenance  Topic Date Due  . Hepatitis C Screening  07-Mar-1956  . ZOSTAVAX  08/31/2013  . PAP SMEAR  03/31/2024 (Originally 10/08/2016)  . DEXA SCAN  10/05/2015  . INFLUENZA VACCINE  10/09/2015  . MAMMOGRAM  06/06/2016  . COLONOSCOPY  08/25/2016  . TETANUS/TDAP  03/10/2021  . HIV Screening  Completed    The following portions of the patient's history were reviewed and updated as appropriate: allergies, current medications, past family history, past medical history, past social history, past surgical history and problem list.  Review of Systems A comprehensive review of systems was negative.   Objective:    BP 140/81 mmHg  Pulse 78  Resp 16  Ht 5\' 3"  (1.6 m)  Wt 135 lb (61.236 kg)  BMI 23.92 kg/m2 General appearance: alert, cooperative and appears stated age Head: Normocephalic, without obvious abnormality, atraumatic Neck: no adenopathy, supple, symmetrical, trachea midline and thyroid not enlarged, symmetric, no tenderness/mass/nodules Lungs: clear to auscultation bilaterally Breasts: normal appearance, no masses or tenderness Heart: regular rate and rhythm, S1, S2 normal, no murmur, click, rub or gallop Abdomen: soft, non-tender; bowel sounds normal; no masses,  no organomegaly Pelvic: cervix normal  in appearance, external genitalia normal, no adnexal masses or tenderness, no cervical motion tenderness, uterus normal size, shape, and consistency and atrophic vagina, external hemorrhoids noted Extremities: extremities normal, atraumatic, no cyanosis or edema Pulses: 2+ and symmetric Skin: Skin color, texture, turgor normal. No rashes or lesions Lymph nodes: Cervical, supraclavicular, and axillary nodes normal. Neurologic: Grossly normal    Assessment:    Healthy female exam.      Plan:      Problem List Items Addressed This Visit    None    Visit Diagnoses    Screening for malignant neoplasm of cervix    -  Primary    Relevant Orders    Cytology - PAP    Encounter for routine gynecological examination           See After Visit Summary for Counseling Recommendations

## 2014-12-08 LAB — CYTOLOGY - PAP

## 2014-12-12 ENCOUNTER — Ambulatory Visit: Payer: 59 | Admitting: Rehabilitation

## 2014-12-19 ENCOUNTER — Ambulatory Visit: Payer: 59 | Attending: Physician Assistant | Admitting: Rehabilitation

## 2014-12-19 DIAGNOSIS — R29898 Other symptoms and signs involving the musculoskeletal system: Secondary | ICD-10-CM | POA: Diagnosis present

## 2014-12-19 DIAGNOSIS — M25511 Pain in right shoulder: Secondary | ICD-10-CM | POA: Insufficient documentation

## 2014-12-19 NOTE — Therapy (Signed)
[facility] [address]  [place], [place], [address] Phone: [phone]   Fax:  [phone]  Physical Therapy Treatment  Patient Details  Name: Jillian Stewart MRN: [id number] Date of Birth: June 26, 1955 Referring Provider:  Donella Stade, PA-C  Encounter Date: 12/19/2014      PT End of Session - 12/19/14 1401    Visit Number 5   Number of Visits 12   Date for PT Re-Evaluation 01/01/15   PT Start Time 2353   PT Stop Time 1448   PT Time Calculation (min) 44 min      Past Medical History  Diagnosis Date  . Hyperlipidemia   . Hypertension     Past Surgical History  Procedure Laterality Date  . Rhinoplasty    . Ovarian cyst surgery    . Breast surgery    . Hip surgery      right and left.      There were no vitals filed for this visit.  Visit Diagnosis:  Pain in joint, shoulder region, right  Shoulder weakness      Subjective Assessment - 12/19/14 1404    Subjective Pt had not been to therapy in a fews due to sickness and other reasons. Has been complaint with stretches while at home but has not performed other exercises. Couldn't tell a difference with the ionto patch and prefers the tape. Did try pressure washing yesterday and her shoulder was killing her last night.    Currently in Pain? Yes   Pain Score 4    Pain Location Shoulder  pec, upper trap, posterior deltoid area.   Pain Orientation Right         TODAY'S TREATMENT: Manual - R Shoulder grade 1-2 AP and caudal glides with gentle stretching into flexion, IR and ER Gentle distraction with oscillations to pt tolerance STM to Rt pec/anterior/lateral shoulder area due to tenderness in supine and Lt side-lying Scapular mobs in Lt side-lying   Ultrasound - 3.3 mHz, 20%, 1.0 w/cm2, x8' to posterior shoulder  4 strips kinesiotape to R shoulder (50% lateral delt, 30% anterior and posterior delt, 70% posterior GH joint line)      PT Short  Term Goals - 11/28/14 0750    PT SHORT TERM GOAL #1   Title pt independent with initial HEP by 12/01/14   Status On-going           PT Long Term Goals - 11/28/14 0750    PT LONG TERM GOAL #1   Title pt independent with advanced HEP as necessary by 01/01/15   Status On-going   PT LONG TERM GOAL #2   Title R Shoulder AROM WFL without c/o pain by 01/01/15   Status On-going   PT LONG TERM GOAL #3   Title pt reports able to sleep without limitation by R shoulder pain by 01/01/15   Status On-going   PT LONG TERM GOAL #4   Title pt able to perform ADLs, chores, and recreational activities with minimal to no restriction by R shoulder pain by 01/01/15   Status On-going   PT LONG TERM GOAL #5   Title R Shoulder MMT 4+/5 or better all planes by 01/01/15   Status On-going               Plan - 12/19/14 1448    Clinical Impression Statement Continued with manual focus today due to pt's high pain levels and reviewed HEP with making corrections to  pt's mechanics during rows. Returned to kinesiotape rather than iontophoresis per pt preference. Minimal progress has been made to date, mostly due to lack of visits.    PT Next Visit Plan scapular stability and RC strengthening; manual and modalities for pain PRN   Consulted and Agree with Plan of Care Patient        Problem List Patient Active Problem List   Diagnosis Date Noted  . Right shoulder pain 11/04/2014  . Degenerative joint disease of left hip 10/27/2014  . History of total left hip arthroplasty 10/27/2014  . Generalized anxiety disorder 03/28/2014  . Depression 03/28/2014  . Degenerative joint disease involving multiple joints 03/28/2014  . Raynaud disease 03/28/2014  . Seasonal allergies 03/28/2014  . Essential hypertension, benign 03/28/2014  . Seborrheic dermatitis 03/28/2014  . Hyperlipidemia 03/28/2014    Barbette Hair, PTA 12/19/2014, 2:54 PM  [facility] [address]  [place], [place], [address] Phone: [phone]   Fax:  [phone]

## 2014-12-21 ENCOUNTER — Ambulatory Visit: Payer: 59 | Admitting: Physical Therapy

## 2014-12-21 DIAGNOSIS — R29898 Other symptoms and signs involving the musculoskeletal system: Secondary | ICD-10-CM

## 2014-12-21 DIAGNOSIS — M25511 Pain in right shoulder: Secondary | ICD-10-CM | POA: Diagnosis not present

## 2014-12-21 NOTE — Therapy (Signed)
[facility] [address]  [place], [place], [address] Phone: [phone]   Fax:  [phone]  Physical Therapy Treatment  Patient Details  Name: Jillian Stewart MRN: [id number] Date of Birth: [birth year] Referring Provider:  Donella Stade, PA-C  Encounter Date: 12/21/2014      PT End of Session - 12/21/14 1330    Visit Number 6   Number of Visits 12   Date for PT Re-Evaluation 01/01/15   PT Start Time 0962   PT Stop Time 1410   PT Time Calculation (min) 52 min      Past Medical History  Diagnosis Date  . Hyperlipidemia   . Hypertension     Past Surgical History  Procedure Laterality Date  . Rhinoplasty    . Ovarian cyst surgery    . Breast surgery    . Hip surgery      right and left.      There were no vitals filed for this visit.  Visit Diagnosis:  Pain in joint, shoulder region, right  Shoulder weakness      Subjective Assessment - 12/21/14 1321    Subjective Pt states has required continued use of pain medication to cope with pain. She states she has attempted to not take meds every now and then but states pain increases quickly.  She states she hasn't noted significant improvement in shoulder since beginning PT but also admits to using power washer recently and noted increased pain with this.   Currently in Pain? Yes   Pain Score 8   pt rates pain 4/10 on average while taking medication but c/o 8-9/10 when not taking medication.   Pain Location Shoulder   Pain Orientation Right   Pain Descriptors / Indicators Stabbing         TODAY'S TREATMENT R Shoulder assessment Manual - pt supine grade 2 AP and Caudal glides to R GH.  TPR and STM with stretch to R pec. TherEx - Hooklying on 1/2 FR B UE pullover AAROM 10x, rhythmic stab in 90 Flexion x30"            PT Education - 12/22/14 1002    Education provided Yes   Education Details reviewed importance of avoiding painful  activities to allow shoudler to heal; added supine pullover AAROM to HEP   Person(s) Educated Patient   Methods Explanation;Demonstration   Comprehension Verbalized understanding;Returned demonstration          PT Short Term Goals - 12/22/14 0959    PT SHORT TERM GOAL #1   Title pt independent with initial HEP by 12/01/14   Status Achieved           PT Long Term Goals - 12/22/14 0959    PT LONG TERM GOAL #1   Title pt independent with advanced HEP as necessary by 01/01/15   Status On-going   PT LONG TERM GOAL #2   Title R Shoulder AROM WFL without c/o pain by 01/01/15   Status On-going   PT LONG TERM GOAL #3   Title pt reports able to sleep without limitation by R shoulder pain by 01/01/15   Status On-going   PT LONG TERM GOAL #4   Title pt able to perform ADLs, chores, and recreational activities with minimal to no restriction by R shoulder pain by 01/01/15   Status On-going   PT LONG TERM GOAL #5   Title R Shoulder MMT 4+/5 or better all planes by  01/01/15   Status On-going               Plan - 12/22/14 1005    Clinical Impression Statement pt with increased pain lately in part related to using pressure washer recently.  Overall she denies noting much benefit since beginning PT and assessment today notes increased pain to anterior shoulder in area of biceps tendon.  Special testing still inconclusive: subacromial impingement is present with possible RC injury, today also questionable biceps tendonopathy vs labral injury.  Pt states she is going to contact referring provider due to pain and lack of progress to look into MRI.  This seems like a good idea; however, I don't feel pt is surgical candidate and I still feel she will benefit from PT if she is able to back off on use of R UE.   PT Next Visit Plan scapular stability and RC strengthening; manual and modalities for pain PRN   Consulted and Agree with Plan of Care Patient        Problem List Patient Active  Problem List   Diagnosis Date Noted  . Right shoulder pain 11/04/2014  . Degenerative joint disease of left hip 10/27/2014  . History of total left hip arthroplasty 10/27/2014  . Generalized anxiety disorder 03/28/2014  . Depression 03/28/2014  . Degenerative joint disease involving multiple joints 03/28/2014  . Raynaud disease 03/28/2014  . Seasonal allergies 03/28/2014  . Essential hypertension, benign 03/28/2014  . Seborrheic dermatitis 03/28/2014  . Hyperlipidemia 03/28/2014    Lonell Stamos PT, OCS 12/22/2014, 10:09 AM  [facility] [address]  [place], [place], [address] Phone: [phone]   Fax:  [phone]

## 2014-12-22 ENCOUNTER — Telehealth: Payer: Self-pay | Admitting: *Deleted

## 2014-12-22 DIAGNOSIS — M25511 Pain in right shoulder: Secondary | ICD-10-CM

## 2014-12-22 NOTE — Telephone Encounter (Signed)
Pt called & would like to go ahead and go through with the bilateral shoulder MRI.  She has been doing PT and she said it seems to be bothering other areas of her shoulder that wasn't necessarily hurting before and she is starting to be bothered with the other shoulder as well.

## 2014-12-25 ENCOUNTER — Ambulatory Visit (INDEPENDENT_AMBULATORY_CARE_PROVIDER_SITE_OTHER): Payer: 59

## 2014-12-25 ENCOUNTER — Other Ambulatory Visit: Payer: Self-pay | Admitting: Physician Assistant

## 2014-12-25 DIAGNOSIS — M25411 Effusion, right shoulder: Secondary | ICD-10-CM

## 2014-12-25 DIAGNOSIS — M19011 Primary osteoarthritis, right shoulder: Secondary | ICD-10-CM

## 2014-12-25 DIAGNOSIS — M65811 Other synovitis and tenosynovitis, right shoulder: Secondary | ICD-10-CM

## 2014-12-25 DIAGNOSIS — M25511 Pain in right shoulder: Secondary | ICD-10-CM

## 2014-12-25 NOTE — Telephone Encounter (Signed)
Ok will order MRI of shoulder.

## 2014-12-25 NOTE — Telephone Encounter (Signed)
MRI ordered

## 2014-12-26 ENCOUNTER — Ambulatory Visit: Payer: 59 | Admitting: Physical Therapy

## 2014-12-26 DIAGNOSIS — M25511 Pain in right shoulder: Secondary | ICD-10-CM

## 2014-12-26 DIAGNOSIS — R29898 Other symptoms and signs involving the musculoskeletal system: Secondary | ICD-10-CM

## 2014-12-26 NOTE — Therapy (Addendum)
[facility] [address]  [place], [place], [address] Phone: [phone]   Fax:  [phone]  Physical Therapy Treatment  Patient Details  Name: Jillian Stewart MRN: [id number] Date of Birth: 1955/08/09 No Data Recorded  Encounter Date: 12/26/2014      PT End of Session - 12/26/14 1611    Visit Number 7   Number of Visits 12   Date for PT Re-Evaluation 01/01/15   PT Start Time 7741   PT Stop Time 1611   PT Time Calculation (min) 41 min   Activity Tolerance Patient tolerated treatment well   Behavior During Therapy [facility] for tasks assessed/performed      Past Medical History  Diagnosis Date  . Hyperlipidemia   . Hypertension     Past Surgical History  Procedure Laterality Date  . Rhinoplasty    . Ovarian cyst surgery    . Breast surgery    . Hip surgery      right and left.      There were no vitals filed for this visit.  Visit Diagnosis:  Pain in joint, shoulder region, right  Shoulder weakness      Subjective Assessment - 12/26/14 1534    Subjective Read MRI results.  Overall shoulder is feeling "ok."  In pain all weekend but feeling better today    Currently in Pain? Yes   Pain Score 5    Pain Location Shoulder   Pain Orientation Right   Pain Descriptors / Indicators Stabbing   Pain Onset More than a month ago   Pain Frequency Intermittent   Aggravating Factors  quick movements, opening car door, horizontal addct, reaching overhead and behind back   Pain Relieving Factors medication, heat, ice                         [facility] Adult PT Treatment/Exercise - 12/26/14 1537    Exercises   Exercises Shoulder   Shoulder Exercises: Supine   Protraction Strengthening;20 reps;Weights   Protraction Weight (lbs) 5   Shoulder Exercises: Standing   External Rotation Right;20 reps;Theraband   Theraband Level (Shoulder External Rotation) Level 1 (Yellow)   Shoulder Exercises:  ROM/Strengthening   UBE (Upper Arm Bike) L 1.5 x 6 min, 3 min forward/3 min backward   Cybex Row Limitations 15# 2x10 both grips   Wall Pushups 10 reps   Ball on Wall CW/CCW and up/down and side to side x 15 sec                PT Education - 12/26/14 1611    Education provided Yes   Education Details reviewed MRI results with pt as she saw results through [employer]) Educated Patient   Methods Explanation   Comprehension Verbalized understanding          PT Short Term Goals - 12/22/14 0959    PT SHORT TERM GOAL #1   Title pt independent with initial HEP by 12/01/14   Status Achieved           PT Long Term Goals - 12/22/14 0959    PT LONG TERM GOAL #1   Title pt independent with advanced HEP as necessary by 01/01/15   Status On-going   PT LONG TERM GOAL #2   Title R Shoulder AROM WFL without c/o pain by 01/01/15   Status On-going   PT LONG TERM GOAL #3   Title pt reports  able to sleep without limitation by R shoulder pain by 01/01/15   Status On-going   PT LONG TERM GOAL #4   Title pt able to perform ADLs, chores, and recreational activities with minimal to no restriction by R shoulder pain by 01/01/15   Status On-going   PT LONG TERM GOAL #5   Title R Shoulder MMT 4+/5 or better all planes by 01/01/15   Status On-going               Plan - 12/26/14 1612    Clinical Impression Statement Pt tolerated session well today without increase in pain.  Will continue to benefit from PT to decrease pain and improve strength.   PT Next Visit Plan scapular stability and RC strengthening; manual and modalities for pain PRN   Consulted and Agree with Plan of Care Patient        Problem List Patient Active Problem List   Diagnosis Date Noted  . Right shoulder pain 11/04/2014  . Degenerative joint disease of left hip 10/27/2014  . History of total left hip arthroplasty 10/27/2014  . Generalized anxiety disorder 03/28/2014  . Depression 03/28/2014   . Degenerative joint disease involving multiple joints 03/28/2014  . Raynaud disease 03/28/2014  . Seasonal allergies 03/28/2014  . Essential hypertension, benign 03/28/2014  . Seborrheic dermatitis 03/28/2014  . Hyperlipidemia 03/28/2014   Laureen Abrahams, PT, DPT 12/26/2014 4:13 PM   [facility] [address]  [place], [place], [address] Phone: [phone]   Fax:  [phone]  Name: Jillian Stewart MRN: [id number] Date of Birth: 05/01/1955    PHYSICAL THERAPY DISCHARGE SUMMARY  Visits from Start of Care: 7/12  Current functional level related to goals / functional outcomes: unknown   Remaining deficits: unknown   Education / Equipment: HEP  Plan: Patient agrees to discharge.  Patient goals were not met. Patient is being discharged due to the patient's request.  ?????       Pt transferred to [facility] clinic in [place] where new POC developed so she is being discharged from this clinic but will still be under care of [facility].  Leonette Most PT, OCS 01/31/2015 10:53 AM

## 2014-12-28 ENCOUNTER — Encounter: Payer: Self-pay | Admitting: Family Medicine

## 2014-12-28 ENCOUNTER — Ambulatory Visit (INDEPENDENT_AMBULATORY_CARE_PROVIDER_SITE_OTHER): Payer: 59 | Admitting: Family Medicine

## 2014-12-28 ENCOUNTER — Ambulatory Visit: Payer: 59

## 2014-12-28 VITALS — BP 124/78 | HR 74 | Wt 136.0 lb

## 2014-12-28 DIAGNOSIS — M25511 Pain in right shoulder: Secondary | ICD-10-CM | POA: Diagnosis not present

## 2014-12-28 NOTE — Progress Notes (Signed)
   Subjective:    I'm seeing this patient as a consultation for:  Iran Planas PA-C  CC: Right shoulder pain  HPI: Patient has 4-5 month history of right shoulder pain. She's had x-rays subacromial bursa injection and PT which have not helped much at all. She notes the pain is like a toothache in her shoulder which does not seem to change too much with shoulder motion. She denies any fevers chills nausea vomiting diarrhea or injury. She notes chronic left shoulder pain diagnosed as DJD. She's been offered shoulder replacement surgery for the right but is not interested in it at this time.  She had an MRI this week which showed significant synovitis of the glenohumeral joint.  Past medical history, Surgical history, Family history not pertinant except as noted below, Social history, Allergies, and medications have been entered into the medical record, reviewed, and no changes needed.   Review of Systems: No headache, visual changes, nausea, vomiting, diarrhea, constipation, dizziness, abdominal pain, skin rash, fevers, chills, night sweats, weight loss, swollen lymph nodes, body aches, joint swelling, muscle aches, chest pain, shortness of breath, mood changes, visual or auditory hallucinations.   Objective:    Filed Vitals:   12/28/14 1451  BP: 124/78  Pulse: 74   General: Well Developed, well nourished, and in no acute distress.  Neuro/Psych: Alert and oriented x3, extra-ocular muscles intact, able to move all 4 extremities, sensation grossly intact. Skin: Warm and dry, no rashes noted.  Respiratory: Not using accessory muscles, speaking in full sentences, trachea midline.  Cardiovascular: Pulses palpable, no extremity edema. Abdomen: Does not appear distended. MSK: Right shoulder well-appearing nontender slow limited motion. Mildly positive impingement testing. Strength is intact. Pulses capillary refill sensation intact distally  MRI right shoulder reviewed   Procedure: Real-time  Ultrasound Guided Injection of right glenohumeral joint  Device: GE Logiq E  Images permanently stored and available for review in the ultrasound unit. Verbal informed consent obtained. Discussed risks and benefits of procedure. Warned about infection bleeding damage to structures skin hypopigmentation and fat atrophy among others. Patient expresses understanding and agreement Time-out conducted.  Noted no overlying erythema, induration, or other signs of local infection.  Skin prepped in a sterile fashion.  Local anesthesia: Topical Ethyl chloride.  With sterile technique and under real time ultrasound guidance: 80 mg of Depo-Medrol and 4 mL of Marcaine injected easily.  Completed without difficulty  Pain immediately resolved suggesting accurate placement of the medication.  Advised to call if fevers/chills, erythema, induration, drainage, or persistent bleeding.  Images permanently stored and available for review in the ultrasound unit.  Impression: Technically successful ultrasound guided injection.    No results found for this or any previous visit (from the past 24 hour(s)). No results found.  Impression and Recommendations:   This case required medical decision making of moderate complexity.

## 2014-12-28 NOTE — Assessment & Plan Note (Signed)
With significant synovitis on MRI. Patient had immediate resolution of pain with injection of Marcaine. Glenohumeral joint is the dominant pain generator. Hopefully the Depo-Medrol will have a lasting benefit. Return in a few weeks.

## 2014-12-28 NOTE — Patient Instructions (Signed)
Thank you for coming in today. Call or go to the ER if you develop a large red swollen joint with extreme pain or oozing puss.   Return in 3 weeks.

## 2015-01-16 ENCOUNTER — Ambulatory Visit (INDEPENDENT_AMBULATORY_CARE_PROVIDER_SITE_OTHER): Payer: 59 | Admitting: Family Medicine

## 2015-01-16 ENCOUNTER — Encounter: Payer: Self-pay | Admitting: Family Medicine

## 2015-01-16 VITALS — BP 132/80 | HR 76 | Wt 136.0 lb

## 2015-01-16 DIAGNOSIS — M25511 Pain in right shoulder: Secondary | ICD-10-CM

## 2015-01-16 DIAGNOSIS — M150 Primary generalized (osteo)arthritis: Secondary | ICD-10-CM

## 2015-01-16 DIAGNOSIS — M159 Polyosteoarthritis, unspecified: Secondary | ICD-10-CM

## 2015-01-16 MED ORDER — NITROGLYCERIN 0.2 MG/HR TD PT24: MEDICATED_PATCH | TRANSDERMAL | Status: DC

## 2015-01-16 NOTE — Patient Instructions (Signed)
Thank you for coming in today. Call or go to the ER if you develop a large red swollen joint with extreme pain or oozing puss.   Do formal PT.  Return in 1 month.   Nitroglycerin Protocol   Apply 1/4 nitroglycerin patch to affected area daily.  Change position of patch within the affected area every 24 hours.  You may experience a headache during the first 1-2 weeks of using the patch, these should subside.  If you experience headaches after beginning nitroglycerin patch treatment, you may take your preferred over the counter pain reliever.  Another side effect of the nitroglycerin patch is skin irritation or rash related to patch adhesive.  Please notify our office if you develop more severe headaches or rash, and stop the patch.  Tendon healing with nitroglycerin patch may require 12 to 24 weeks depending on the extent of injury.  Men should not use if taking Viagra, Cialis, or Levitra.   Do not use if you have migraines or rosacea.

## 2015-01-16 NOTE — Progress Notes (Signed)
Jillian Stewart is a 59 y.o. female who presents to [facility]  today for follow up right shoulder pain. Patient was seen on Oct 20th for right shoulder pain. At that time she had the pain for 5 months. She received an injection with Depo-Medrol and Marcaine with some relief. She returned to normal activity thereafter and noticed a return of pain with lifting objects and reaching above her head. She has tried ice and and daily naproxen without relief.   Of note, she last saw PT 2 weeks ago and the visit made it worse. She notes she had PT for her L shoulder in past and had much better outcomes with gentle, gradual approach to exercises.     Past Medical History  Diagnosis Date  . Hyperlipidemia   . Hypertension    Past Surgical History  Procedure Laterality Date  . Rhinoplasty    . Ovarian cyst surgery    . Breast surgery    . Hip surgery      right and left.     Social History  Substance Use Topics  . Smoking status: Former Smoker    Types: Cigarettes    Quit date: 03/13/2003  . Smokeless tobacco: Never Used  . Alcohol Use: 0.6 - 1.2 oz/week    1-2 Cans of beer per week   family history includes Cancer in her mother; Diabetes in an other family member; Stroke in her father.  ROS as above Medications: Current Outpatient Prescriptions  Medication Sig Dispense Refill  . amLODipine (NORVASC) 5 MG tablet Take 1 tablet (5 mg total) by mouth daily. 90 tablet 1  . DULoxetine (CYMBALTA) 60 MG capsule Take 1 capsule (60 mg total) by mouth daily. 90 capsule 1  . ezetimibe (ZETIA) 10 MG tablet Take 1 tablet (10 mg total) by mouth daily. 30 tablet 5  . Naproxen-Esomeprazole 375-20 MG TBEC Take 1 tablet twice a day. 60 tablet 1  . nitroGLYCERIN (NITRODUR - DOSED IN MG/24 HR) 0.2 mg/hr patch 1/4 patch to shoulder daily for tendonitis 30 patch 1   No current facility-administered medications for this visit.   Allergies  Allergen Reactions  . Statins  Other (See Comments)    Severe muscle cramps.  Pravastatin, Simvastatin, Atorvastatin, & Livalo     Exam:  BP 132/80 mmHg  Pulse 76  Wt 136 lb (61.689 kg) Gen: Well NAD HEENT: EOMI,  MMM Lungs: Normal work of breathing. CTABL Heart: RRR no MRG Abd: NABS, Soft. Nondistended, Nontender Exts: Brisk capillary refill, warm and well perfused.  MSK: Full ROM with L arm abduction, external arm rotation Internal rotation to 5cm superior to her iliac crest on her back Empty/full can testing reproduces pain in R lateral shoulder at the end of the motion No pain with supination Pain in R lateral arm from the acromion to the midarm with forearm flexion Clicking with labral testing  Negative impingement testing ([place])  No results found for this or any previous visit (from the past 24 hour(s)). No results found.  Procedure: Real-time Ultrasound Guided Injection of right glenohumeral joint  Device: GE Logiq E  Images permanently stored and available for review in the ultrasound unit. Verbal informed consent obtained. Discussed risks and benefits of procedure. Warned about infection bleeding damage to structures skin hypopigmentation and fat atrophy among others. Patient expresses understanding and agreement Time-out conducted.  Noted no overlying erythema, induration, or other signs of local infection.  Skin prepped in a sterile fashion.  Local anesthesia: Topical Ethyl chloride.  With sterile technique and under real time ultrasound guidance: 80 mg of Depo-Medrol and 4 mL of Marcaine injected easily.  Completed without difficulty  Pain immediately resolved suggesting accurate placement of the medication.  Advised to call if fevers/chills, erythema, induration, drainage, or persistent bleeding.  Images permanently stored and available for review in the ultrasound unit.  Impression: Technically successful ultrasound guided injection.  Please see individual assessment and  plan sections.

## 2015-01-16 NOTE — Assessment & Plan Note (Signed)
Chronic, worsening. Unclear etiology. Depo-Medrol and Marcaine injection completed in clinic today. Counseled patient that this would be last injection, hopefully diagnostic of location of pathology. Refer to new PT clinic and advance exercises slowly. Will also use NTG patches.  Patient counseled on NTG patch and side effects.  Return 4 weeks.

## 2015-01-22 ENCOUNTER — Encounter: Payer: Self-pay | Admitting: Rehabilitative and Restorative Service Providers"

## 2015-01-22 ENCOUNTER — Ambulatory Visit (INDEPENDENT_AMBULATORY_CARE_PROVIDER_SITE_OTHER): Payer: 59 | Admitting: Rehabilitative and Restorative Service Providers"

## 2015-01-22 DIAGNOSIS — R293 Abnormal posture: Secondary | ICD-10-CM

## 2015-01-22 DIAGNOSIS — Z7409 Other reduced mobility: Secondary | ICD-10-CM | POA: Diagnosis not present

## 2015-01-22 DIAGNOSIS — M25511 Pain in right shoulder: Secondary | ICD-10-CM

## 2015-01-22 NOTE — Therapy (Signed)
[place], [place], [address] Phone: [phone]   Fax:  [phone]  Physical Therapy Treatment  Patient Details  Name: Jillian Stewart MRN: [id number] Date of Birth: August 18, 1955 No Data Recorded  Encounter Date: 01/22/2015      PT End of Session - 01/22/15 1542    Visit Number 1   Number of Visits 12   Date for PT Re-Evaluation 03/06/15   PT Start Time [id number]   PT Stop Time 1536   PT Time Calculation (min) 58 min   Activity Tolerance Patient tolerated treatment well      Past Medical History  Diagnosis Date  . Hyperlipidemia   . Hypertension     Past Surgical History  Procedure Laterality Date  . Rhinoplasty    . Ovarian cyst surgery    . Breast surgery    . Hip surgery      right and left.      There were no vitals filed for this visit.  Visit Diagnosis:  Abnormal posture - Plan: PT plan of care cert/re-cert  Pain in joint, shoulder region, right - Plan: PT plan of care cert/re-cert  Impaired functional mobility and endurance - Plan: PT plan of care cert/re-cert      Subjective Assessment - 01/22/15 1443    Subjective patient reports that she was seen for several visits for shoulder pain in [place] with no significant improvement. She continued to have pain even on the antiinflammatory meds. She feels that the bone spur on the Rt shoulder irritates the rehab process. Contined pain and limitations in the Rt shoulder which has been present over the 6 months with significant increase in sympotms in the past 2-3 months.    Pertinent History L shoulder degeneration in need of replacement.   Patient Stated Goals to be out of pain and use arm for functional activities   Currently in Pain? Yes   Pain Score 4    Pain Location Shoulder   Pain Orientation Right   Pain Descriptors / Indicators Tightness;Dull;Aching   Pain Type Acute pain   Pain Onset More than a month ago   Pain  Frequency Intermittent   Aggravating Factors  driving; oening car door; reaching across body, overhead and behind back; drying her hair   Pain Relieving Factors ice; meds            [facility] PT Assessment - 01/22/15 0001    Assessment   Medical Diagnosis R Shoulder pain   Onset Date/Surgical Date 03/10/13   Hand Dominance Right   Prior Function   Leisure enjoys knitting; walks regularly, outdoor biking   Observation/Other Assessments   Focus on Therapeutic Outcomes (FOTO)  50% limitation    AROM   AROM Assessment Site Shoulder   Right/Left Shoulder Right   Right Shoulder Flexion 126 Degrees  pain ant   Right Shoulder ABduction 128 Degrees  pain post shd   Right Shoulder Internal Rotation 33 Degrees  shd 90 deg abd   Right Shoulder External Rotation 67 Degrees  shd 90 deg abd    PROM   PROM Assessment Site Shoulder  supine    Right/Left Shoulder Right   Right Shoulder Flexion 148 Degrees  pain   Right Shoulder ABduction 121 Degrees  pain   Right Shoulder Internal Rotation 38 Degrees  pain - with shd stabalized   Right Shoulder External Rotation 78 Degrees  pain    Strength   Overall Strength Comments  5/5 bilat UE's pain with resisted Rt shd ER/IR   Palpation   Palpation comment muscular tendedness and tightness through pecs; upper trap; leveator; teres; supraspinitis Rt > Lt    Special Tests    Special Tests --  (+) empty can; [facility] PT Treatment/Exercise - 01/22/15 0001    Therapeutic Activites    Therapeutic Activities --  myofacial ball release work    Neuro Re-ed    Neuro Re-ed Details  working on posture and alignment focus on activation of posterior shoulder girdle musculature    Shoulder Exercises: Standing   Retraction --  scap squeeze with noodle 10 sec hold 10 reps    Shoulder Exercises: Stretch   Other Shoulder Stretches snowangle prolonged stretch ~80 deg position of comfort    Modalities    Modalities Electrical Stimulation;Cryotherapy   Cryotherapy   Number Minutes Cryotherapy 15 Minutes   Cryotherapy Location Shoulder   Type of Cryotherapy Ice pack   Electrical Stimulation   Electrical Stimulation Location Rt shoulder    Electrical Stimulation Action IFC   Electrical Stimulation Parameters to tolerance   Electrical Stimulation Goals Pain;Tone                PT Education - 01/22/15 1519    Education provided Yes   Education Details postural educatioin/correction; shoulder dysfunction related to posture; HEP   Person(s) Educated Patient   Methods Explanation;Demonstration;Tactile cues;Verbal cues;Handout   Comprehension Verbalized understanding;Returned demonstration;Verbal cues required;Tactile cues required          PT Short Term Goals - 01/22/15 1546    PT SHORT TERM GOAL #1   Title pt independent with initial HEP by 02/10/15   Time 3   Period Weeks   Status New   PT SHORT TERM GOAL #2   Title Patient demo improved scapular posture and alignment for exercise/ADL's 02/10/15   Time 3   Period Weeks   Status New           PT Long Term Goals - 01/22/15 1547    PT LONG TERM GOAL #1   Title pt independent with advanced HEP for discharge 03/06/15   Time 6   Status New   PT LONG TERM GOAL #2   Title R Shoulder AROM WFL without c/o pain by 03/06/15   Time 6   Period Weeks   Status New   PT LONG TERM GOAL #3   Title pt reports able to sleep without limitation by R shoulder pain by 03/06/15   Time 6   Period Weeks   Status New   PT LONG TERM GOAL #4   Title pt able to perform ADLs, chores, and recreational activities with minimal to no restriction by R shoulder pain by 03/06/15   Time 6   Period Weeks   Status New   PT LONG TERM GOAL #5   Title Improve FOTO to </=36% limitation 03/06/15   Time 6   Period Weeks   Status New               Plan - 01/22/15 1543    Clinical Impression Statement Patient presents with ongoiing Rt  shoulder pain and dysfunction of several months duration with symptoms including pain; limited ROM; limited functional activity level/ She has poor posture and alignment; abnormal shoulder kinematics and poor movement patterns. Pt will benefit form  PT to address problems identified. She has had PT in the past but wants to move more slowly in an effort to avoid increased pain.    Pt will benefit from skilled therapeutic intervention in order to improve on the following deficits Pain;Decreased mobility;Decreased strength;Postural dysfunction;Improper body mechanics;Impaired UE functional use;Decreased range of motion   Rehab Potential Good   PT Frequency 2x / week   PT Duration 6 weeks   PT Treatment/Interventions Dry needling;Taping;Vasopneumatic Device;Manual techniques;Patient/family education;Ultrasound;Therapeutic activities;Therapeutic exercise;Moist Heat;Electrical Stimulation;Cryotherapy;Iontophoresis 4mg /ml Dexamethasone   PT Next Visit Plan scapular stability and RC strengthening; manual and modalities for pain PRN   PT Home Exercise Plan postural correction; HEP; myofacial ball release work    Oncologist with Plan of Care Patient        Problem List Patient Active Problem List   Diagnosis Date Noted  . Right shoulder pain 11/04/2014  . Degenerative joint disease of left hip 10/27/2014  . History of total left hip arthroplasty 10/27/2014  . Generalized anxiety disorder 03/28/2014  . Depression 03/28/2014  . Degenerative joint disease involving multiple joints 03/28/2014  . Raynaud disease 03/28/2014  . Seasonal allergies 03/28/2014  . Essential hypertension, benign 03/28/2014  . Seborrheic dermatitis 03/28/2014  . Hyperlipidemia 03/28/2014    Jenille Laszlo Nilda Simmer PT, MPH 01/22/2015, 3:53 PM  [facility] [place], [place], [address] Phone: [phone]   Fax:  [phone]  Name: Lynnet Strandberg MRN:  [id number] Date of Birth: [birth year]

## 2015-01-22 NOTE — Patient Instructions (Signed)
Axial Extension (Chin Tuck)    Pull chin in and lengthen back of neck. Hold __10-15__ seconds while counting out loud. Repeat _5-10___ times. Do _several___ sessions per day.   Scapular Retraction (Standing)    With arms at sides, pinch shoulder blades down and back. Can use swim noodle along spine as a tactile cue Hold 10 sec Repeat _10__ times per set. Do ___several  sessions per day.  Lying on back with arms out to sides resting on the bed or floor at an angle that provides a gentle stretch.  Hold 5 min - can bend elbows to relax stretch for a few seconds as needed.  myofacial release work - using ~ 4 [employer] ball  Through back; shoulder and pecs 3-5 mins; several times a day  (All ball; Sport time yellow ball - 4 in from [employer])   Pillow to rest top arm on while side sleeping  Avoid soft cushy chairs/sofas

## 2015-01-25 ENCOUNTER — Encounter: Payer: Self-pay | Admitting: Rehabilitative and Restorative Service Providers"

## 2015-01-25 ENCOUNTER — Ambulatory Visit (INDEPENDENT_AMBULATORY_CARE_PROVIDER_SITE_OTHER): Payer: 59 | Admitting: Rehabilitative and Restorative Service Providers"

## 2015-01-25 DIAGNOSIS — M25511 Pain in right shoulder: Secondary | ICD-10-CM | POA: Diagnosis not present

## 2015-01-25 DIAGNOSIS — R293 Abnormal posture: Secondary | ICD-10-CM

## 2015-01-25 DIAGNOSIS — R29898 Other symptoms and signs involving the musculoskeletal system: Secondary | ICD-10-CM

## 2015-01-25 DIAGNOSIS — Z7409 Other reduced mobility: Secondary | ICD-10-CM | POA: Diagnosis not present

## 2015-01-25 NOTE — Therapy (Signed)
[place], [place], [address] Phone: [phone]   Fax:  [phone]  Physical Therapy Treatment  Patient Details  Name: Jillian Stewart MRN: [id number] Date of Birth: 1956-02-06 No Data Recorded  Encounter Date: 01/25/2015      PT End of Session - 01/25/15 1526    Visit Number 2   Number of Visits 12   Date for PT Re-Evaluation 03/06/15   PT Start Time 1527   PT Stop Time 1618   PT Time Calculation (min) 51 min   Activity Tolerance Patient tolerated treatment well      Past Medical History  Diagnosis Date  . Hyperlipidemia   . Hypertension     Past Surgical History  Procedure Laterality Date  . Rhinoplasty    . Ovarian cyst surgery    . Breast surgery    . Hip surgery      right and left.      There were no vitals filed for this visit.  Visit Diagnosis:  Abnormal posture  Pain in joint, shoulder region, right  Impaired functional mobility and endurance  Shoulder weakness      Subjective Assessment - 01/25/15 1527    Subjective Patient reports that she did well after treatment. She has been trying to do exercises at home. Has not been able to find a ball for myofacial release work.    Currently in Pain? No/denies                         [facility] Adult PT Treatment/Exercise - 01/25/15 0001    Neuro Re-ed    Neuro Re-ed Details  continue work on posture and alignment focus on activation of posterior shoulder girdle musculature    Exercises   Exercises Shoulder   Shoulder Exercises: Standing   External Rotation Strengthening;Both;10 reps;Theraband   Theraband Level (Shoulder External Rotation) Level 1 (Yellow)   Extension Strengthening;Both;10 reps;Theraband   Theraband Level (Shoulder Extension) Level 1 (Yellow)   Row Strengthening;Both;10 reps;Theraband   Theraband Level (Shoulder Row) Level 1 (Yellow)   Retraction --  scap squeeze with noodle 10 sec hold  10 reps    Shoulder Exercises: Stretch   Corner Stretch Limitations doorway 3 positioins 20-30 sec hold 3 each    Other Shoulder Stretches snowangle prolonged stretch ~80 deg position of comfort    Modalities   Modalities Electrical Stimulation;Cryotherapy   Cryotherapy   Number Minutes Cryotherapy 15 Minutes   Cryotherapy Location Shoulder   Type of Cryotherapy Ice pack   Electrical Stimulation   Electrical Stimulation Location Rt shoulder    Electrical Stimulation Action IFC   Electrical Stimulation Parameters to tolerance   Electrical Stimulation Goals Pain;Tone   Manual Therapy   Manual therapy comments Pt supine working Rt shoudler    Joint Mobilization Medon joint mobs Grade II AP/caudal glides   Soft tissue mobilization pecs/upper trap/teres/leveator   Myofascial Release ecs/ant chest   Scapular Mobilization Rt lateral glides and working through anterior medical scapula   Passive ROM PROM Rt shoulder flex/abd/ER/ext                 PT Education - 01/25/15 1536    Education provided Yes   Education Details postural correction; HEP   Person(s) Educated Patient   Methods Explanation;Demonstration;Tactile cues;Verbal cues;Handout   Comprehension Verbalized understanding;Returned demonstration;Verbal cues required;Tactile cues required          PT Short Term Goals -  01/22/15 1546    PT SHORT TERM GOAL #1   Title pt independent with initial HEP by 02/10/15   Time 3   Period Weeks   Status New   PT SHORT TERM GOAL #2   Title Patient demo improved scapular posture and alignment for exercise/ADL's 02/10/15   Time 3   Period Weeks   Status New           PT Long Term Goals - 01/22/15 1547    PT LONG TERM GOAL #1   Title pt independent with advanced HEP for discharge 03/06/15   Time 6   Status New   PT LONG TERM GOAL #2   Title R Shoulder AROM WFL without c/o pain by 03/06/15   Time 6   Period Weeks   Status New   PT LONG TERM GOAL #3   Title pt reports  able to sleep without limitation by R shoulder pain by 03/06/15   Time 6   Period Weeks   Status New   PT LONG TERM GOAL #4   Title pt able to perform ADLs, chores, and recreational activities with minimal to no restriction by R shoulder pain by 03/06/15   Time 6   Period Weeks   Status New   PT LONG TERM GOAL #5   Title Improve FOTO to </=36% limitation 03/06/15   Time 6   Period Weeks   Status New               Plan - 01/25/15 1608    Clinical Impression Statement Good response to treatment. Pt reports less pain and that she is resting better. She has noticed less pain and improved functional activity level. She has done her exercises at home but has some difficulty with reproducing the exercises at home - requiring correction during todays treatment. Good response to manual work and stretching followed by ice/stim.    Pt will benefit from skilled therapeutic intervention in order to improve on the following deficits Pain;Decreased mobility;Decreased strength;Postural dysfunction;Improper body mechanics;Impaired UE functional use;Decreased range of motion   Rehab Potential Good   PT Frequency 2x / week   PT Duration 6 weeks   PT Treatment/Interventions Dry needling;Taping;Vasopneumatic Device;Manual techniques;Patient/family education;Ultrasound;Therapeutic activities;Therapeutic exercise;Moist Heat;Electrical Stimulation;Cryotherapy;Iontophoresis 4mg /ml Dexamethasone   PT Next Visit Plan scapular stability and RC strengthening; manual and modalities for pain PRN   PT Home Exercise Plan postural correction; HEP; myofacial ball release work    Oncologist with Plan of Care Patient        Problem List Patient Active Problem List   Diagnosis Date Noted  . Right shoulder pain 11/04/2014  . Degenerative joint disease of left hip 10/27/2014  . History of total left hip arthroplasty 10/27/2014  . Generalized anxiety disorder 03/28/2014  . Depression 03/28/2014  .  Degenerative joint disease involving multiple joints 03/28/2014  . Raynaud disease 03/28/2014  . Seasonal allergies 03/28/2014  . Essential hypertension, benign 03/28/2014  . Seborrheic dermatitis 03/28/2014  . Hyperlipidemia 03/28/2014    Jayni Prescher Nilda Simmer PT, MPH 01/25/2015, 4:12 PM  [facility] [place], [place], [address] Phone: [phone]   Fax:  [phone]  Name: Jillian Stewart MRN: [id number] Date of Birth: 12-23-1955

## 2015-01-25 NOTE — Addendum Note (Signed)
Addended by: Everardo All on: 01/25/2015 02:48 PM   Modules accepted: Orders

## 2015-01-25 NOTE — Patient Instructions (Signed)
Scapula Adduction With Pectoralis Stretch: Low - Standing   Shoulders at 45 hands even with shoulders, keeping weight through legs, shift weight forward until you feel pull or stretch through the front of your chest. Hold _30__ seconds. Do _3__ times, _2-4__ times per day.   Scapula Adduction With Pectoralis Stretch: Mid-Range - Standing   Shoulders at 90 elbows even with shoulders, keeping weight through legs, shift weight forward until you feel pull or strength through the front of your chest. Hold __30_ seconds. Do _3__ times, __2-4_ times per day.   Scapula Adduction With Pectoralis Stretch: High - Standing   Shoulders at 120 hands up high on the doorway, keeping weight on feet, shift weight forward until you feel pull or stretch through the front of your chest. Hold _30__ seconds. Do _3__ times, _2-3__ times per day.   Resisted External Rotation: in Neutral - Bilateral   PALMS UP Sit or stand, tubing in both hands, elbows at sides, bent to 90, forearms forward. Pinch shoulder blades together and rotate forearms out. Keep elbows at sides. Repeat __10__ times per set. Do _2-3___ sets per session. Do _2-3___ sessions per day.   Low Row: Standing   Face anchor, feet shoulder width apart. Palms up, pull arms back, squeezing shoulder blades together. Repeat 10__ times per set. Do 2-3__ sets per session. Do 2-3__ sessions per week. Anchor Height: Waist   Strengthening: Resisted Extension   Hold tubing in right hand, arm forward. Pull arm back, elbow straight. Repeat _10___ times per set. Do 2-3____ sets per session. Do 2-3____ sessions per day.   

## 2015-01-30 ENCOUNTER — Encounter: Payer: Self-pay | Admitting: Rehabilitative and Restorative Service Providers"

## 2015-01-30 ENCOUNTER — Ambulatory Visit (INDEPENDENT_AMBULATORY_CARE_PROVIDER_SITE_OTHER): Payer: 59 | Admitting: Rehabilitative and Restorative Service Providers"

## 2015-01-30 DIAGNOSIS — M25511 Pain in right shoulder: Secondary | ICD-10-CM | POA: Diagnosis not present

## 2015-01-30 DIAGNOSIS — R29898 Other symptoms and signs involving the musculoskeletal system: Secondary | ICD-10-CM

## 2015-01-30 DIAGNOSIS — R293 Abnormal posture: Secondary | ICD-10-CM

## 2015-01-30 DIAGNOSIS — Z7409 Other reduced mobility: Secondary | ICD-10-CM

## 2015-01-30 NOTE — Therapy (Signed)
[facility] [place], [place], [address] Phone: [phone]   Fax:  [phone]  Physical Therapy Treatment  Patient Details  Name: Jillian Stewart MRN: [id number] Date of Birth: 07-26-1955 No Data Recorded  Encounter Date: 01/30/2015      PT End of Session - 01/30/15 1535    Visit Number 3   Number of Visits 12   Date for PT Re-Evaluation 03/06/15   PT Start Time [id number]   PT Stop Time 1609   PT Time Calculation (min) 54 min   Activity Tolerance Patient tolerated treatment well      Past Medical History  Diagnosis Date  . Hyperlipidemia   . Hypertension     Past Surgical History  Procedure Laterality Date  . Rhinoplasty    . Ovarian cyst surgery    . Breast surgery    . Hip surgery      right and left.      There were no vitals filed for this visit.  Visit Diagnosis:  Abnormal posture  Pain in joint, shoulder region, right  Impaired functional mobility and endurance  Shoulder weakness      Subjective Assessment - 01/30/15 1536    Subjective Increased pain in the Rt shoulder the day after PT last visit - symptoms lasted about a day. Just felt tight and sore. Not sure why.    Currently in Pain? Yes   Pain Score 4    Pain Location Shoulder   Pain Orientation Right   Pain Descriptors / Indicators Tightness;Dull;Aching                         [facility] Adult PT Treatment/Exercise - 01/30/15 0001    Neuro Re-ed    Neuro Re-ed Details  continue work on posture and alignment focus on activation of posterior shoulder girdle musculature    Exercises   Exercises Shoulder   Shoulder Exercises: Standing   Retraction --  scap squeeze with noodle 10 sec hold 10 reps    Shoulder Exercises: Therapy Ball   Other Therapy Ball Exercises myofacial ball release work    Shoulder Exercises: IT sales professional Limitations doorway 3 positioins 20-30 sec hold 3 each   required  correction/education    Other Shoulder Stretches snowangle prolonged stretch ~80 deg position of comfort    Modalities   Modalities Electrical Stimulation;Cryotherapy   Cryotherapy   Number Minutes Cryotherapy 15 Minutes   Cryotherapy Location Shoulder   Type of Cryotherapy Ice pack   Electrical Stimulation   Electrical Stimulation Location Rt shoulder    Electrical Stimulation Action IFC   Electrical Stimulation Parameters to tolerance   Electrical Stimulation Goals Pain;Tone   Manual Therapy   Manual therapy comments Pt supine working Rt shoudler    Joint Mobilization [place] joint mobs Grade II AP/caudal glides   Soft tissue mobilization pecs/upper trap/teres/leveator   Myofascial Release pecs/ant chest   Scapular Mobilization Rt lateral glides and working through anterior medical scapula   Passive ROM PROM Rt shoulder flex/abd/ER/ext - gentle                  PT Short Term Goals - 01/22/15 1546    PT SHORT TERM GOAL #1   Title pt independent with initial HEP by 02/10/15   Time 3   Period Weeks   Status New   PT SHORT TERM GOAL #2   Title Patient demo improved scapular posture  and alignment for exercise/ADL's 02/10/15   Time 3   Period Weeks   Status New           PT Long Term Goals - 01/22/15 1547    PT LONG TERM GOAL #1   Title pt independent with advanced HEP for discharge 03/06/15   Time 6   Status New   PT LONG TERM GOAL #2   Title R Shoulder AROM WFL without c/o pain by 03/06/15   Time 6   Period Weeks   Status New   PT LONG TERM GOAL #3   Title pt reports able to sleep without limitation by R shoulder pain by 03/06/15   Time 6   Period Weeks   Status New   PT LONG TERM GOAL #4   Title pt able to perform ADLs, chores, and recreational activities with minimal to no restriction by R shoulder pain by 03/06/15   Time 6   Period Weeks   Status New   PT LONG TERM GOAL #5   Title Improve FOTO to </=36% limitation 03/06/15   Time 6   Period Weeks    Status New               Plan - 01/30/15 1602    Clinical Impression Statement Flare up of symptoms following last visit - possibly related to the PROM/stretching and improper technique for exercises. She has also bought groceries and increased actiivity level in prepration for thanksgiving. Will decreased intensity of PT and moniter response. Adjust as indicated.   Pt will benefit from skilled therapeutic intervention in order to improve on the following deficits Pain;Decreased mobility;Decreased strength;Postural dysfunction;Improper body mechanics;Impaired UE functional use;Decreased range of motion   Rehab Potential Good   PT Frequency 2x / week   PT Duration 6 weeks   PT Treatment/Interventions Dry needling;Taping;Vasopneumatic Device;Manual techniques;Patient/family education;Ultrasound;Therapeutic activities;Therapeutic exercise;Moist Heat;Electrical Stimulation;Cryotherapy;Iontophoresis 4mg /ml Dexamethasone   PT Next Visit Plan scapular stability and RC strengthening; manual and modalities for pain PRN   PT Home Exercise Plan postural correction; HEP; myofacial ball release work    Oncologist with Plan of Care Patient        Problem List Patient Active Problem List   Diagnosis Date Noted  . Right shoulder pain 11/04/2014  . Degenerative joint disease of left hip 10/27/2014  . History of total left hip arthroplasty 10/27/2014  . Generalized anxiety disorder 03/28/2014  . Depression 03/28/2014  . Degenerative joint disease involving multiple joints 03/28/2014  . Raynaud disease 03/28/2014  . Seasonal allergies 03/28/2014  . Essential hypertension, benign 03/28/2014  . Seborrheic dermatitis 03/28/2014  . Hyperlipidemia 03/28/2014    Celyn Nilda Simmer PT, MPH 01/30/2015, 4:11 PM  [facility] [place], [place], [address] Phone: [phone]   Fax:  [phone]  Name: Jillian Stewart MRN:  [id number] Date of Birth: 1955-10-26

## 2015-02-07 ENCOUNTER — Encounter: Payer: 59 | Admitting: Rehabilitative and Restorative Service Providers"

## 2015-02-15 ENCOUNTER — Ambulatory Visit (INDEPENDENT_AMBULATORY_CARE_PROVIDER_SITE_OTHER): Payer: 59 | Admitting: Rehabilitative and Restorative Service Providers"

## 2015-02-15 ENCOUNTER — Ambulatory Visit (INDEPENDENT_AMBULATORY_CARE_PROVIDER_SITE_OTHER): Payer: 59 | Admitting: Family Medicine

## 2015-02-15 ENCOUNTER — Encounter: Payer: Self-pay | Admitting: Rehabilitative and Restorative Service Providers"

## 2015-02-15 ENCOUNTER — Encounter: Payer: Self-pay | Admitting: Family Medicine

## 2015-02-15 VITALS — BP 132/91 | HR 73 | Wt 136.0 lb

## 2015-02-15 DIAGNOSIS — J0100 Acute maxillary sinusitis, unspecified: Secondary | ICD-10-CM

## 2015-02-15 DIAGNOSIS — R29898 Other symptoms and signs involving the musculoskeletal system: Secondary | ICD-10-CM

## 2015-02-15 DIAGNOSIS — R293 Abnormal posture: Secondary | ICD-10-CM

## 2015-02-15 DIAGNOSIS — M25511 Pain in right shoulder: Secondary | ICD-10-CM

## 2015-02-15 DIAGNOSIS — J019 Acute sinusitis, unspecified: Secondary | ICD-10-CM

## 2015-02-15 DIAGNOSIS — Z7409 Other reduced mobility: Secondary | ICD-10-CM

## 2015-02-15 MED ORDER — IPRATROPIUM BROMIDE 0.06 % NA SOLN: 2.0000 | Freq: Four times a day (QID) | NASAL | Status: DC

## 2015-02-15 MED ORDER — AMOXICILLIN 500 MG PO CAPS: 1000.0000 mg | ORAL_CAPSULE | Freq: Two times a day (BID) | ORAL | Status: DC

## 2015-02-15 NOTE — Assessment & Plan Note (Signed)
Improving. Continue PT and follow up as needed.

## 2015-02-15 NOTE — Patient Instructions (Signed)
Thank you for coming in today. You were seen for follow up of your R shoulder pain as well as post nasal drip and fevers. You shoulder is progressing well. Please continue PT.  Your drainage and fevers is most likely due to a sinus infection. Please try Atrovent which will help with the congestion and discharge. If this does not improve in 3 days. Start taking the amoxicillin. Follow up for worsening or persistent symptoms.

## 2015-02-15 NOTE — Progress Notes (Signed)
Jillian Stewart is a 59 y.o. female who presents to [facility] today for follow up R shoulder pain.  Patient notes pain is much improved shoulder range of motion and improving pain. She stopped using the nitro patch 1 week ago as she did not note a difference when using it. She continues to go to PT which has helped her greatly.   She had to miss PT last week because she was sick after coming in contact with her sick grandchildren. She notes 10 days of congestion with green discharge, decreased hearing bilaterally as well as low grade fever throughout this time. She took Afrin yesterday but this did not help. She notes she had sinus surgery in the past but has not had a sinus infection since before that 10 years ago. She denies sinus pressure but feels like she has had drainage causing a sore throat.    Past Medical History  Diagnosis Date  . Hyperlipidemia   . Hypertension    Past Surgical History  Procedure Laterality Date  . Rhinoplasty    . Ovarian cyst surgery    . Breast surgery    . Hip surgery      right and left.     Social History  Substance Use Topics  . Smoking status: Former Smoker    Types: Cigarettes    Quit date: 03/13/2003  . Smokeless tobacco: Never Used  . Alcohol Use: 0.6 - 1.2 oz/week    1-2 Cans of beer per week   family history includes Cancer in her mother; Diabetes in an other family member; Stroke in her father.  ROS as above Medications: Current Outpatient Prescriptions  Medication Sig Dispense Refill  . amLODipine (NORVASC) 5 MG tablet Take 1 tablet (5 mg total) by mouth daily. 90 tablet 1  . amoxicillin (AMOXIL) 500 MG capsule Take 2 capsules (1,000 mg total) by mouth 2 (two) times daily. 40 capsule 0  . DULoxetine (CYMBALTA) 60 MG capsule Take 1 capsule (60 mg total) by mouth daily. 90 capsule 1  . ipratropium (ATROVENT) 0.06 % nasal spray Place 2 sprays into both nostrils 4 (four) times daily. 15 mL 1  .  Naproxen-Esomeprazole 375-20 MG TBEC Take 1 tablet twice a day. (Patient not taking: Reported on 01/22/2015) 60 tablet 1   No current facility-administered medications for this visit.   Allergies  Allergen Reactions  . Statins Other (See Comments)    Severe muscle cramps.  Pravastatin, Simvastatin, Atorvastatin, & Livalo     Exam:  BP 132/91 mmHg  Pulse 73  Wt 136 lb (61.689 kg) Gen: Ill but not toxic appearing lady in NAD HEENT: EOMI,  MMM. TM intact gray transclucent landmarks appreciated normal position. Posterior OP erythema.  Lungs: Normal work of breathing. CTABL no wheezes crackles or rhonchi  Heart: RRR no MRG Abd: NABS, Soft. Nondistended, Nontender Exts: Brisk capillary refill, warm and well perfused.  MSK:  R shoulder No visual or palpable deformities  Tender to lateral shoulder and anterior arm  ROM 85 degrees with adduction Strength 5/5 bilateral UE    No results found for this or any previous visit (from the past 24 hour(s)). No results found.   Please see individual assessment and plan sections.

## 2015-02-15 NOTE — Assessment & Plan Note (Signed)
Likely bacterial given 10 day course, low grade fever, post nasal drip and decreased hearing loss. Will trial Atrovent for 3 days and if no improvement, patient to start Amoxicillin 1000mg  bid.

## 2015-02-15 NOTE — Therapy (Addendum)
Jillian Stewart, [place], [address] Phone: [phone]   Fax:  [phone]  Physical Therapy Treatment  Patient Details  Name: Jillian Stewart MRN: [id number] Date of Birth: 12/05/1955 No Data Recorded  Encounter Date: 02/15/2015      PT End of Session - 02/15/15 1406    Visit Number 4   Number of Visits 12   Date for PT Re-Evaluation 03/06/15   PT Start Time 2641   PT Stop Time 1454   PT Time Calculation (min) 49 min   Activity Tolerance Patient tolerated treatment well      Past Medical History  Diagnosis Date  . Hyperlipidemia   . Hypertension     Past Surgical History  Procedure Laterality Date  . Rhinoplasty    . Ovarian cyst surgery    . Breast surgery    . Hip surgery      right and left.      There were no vitals filed for this visit.  Visit Diagnosis:  Abnormal posture  Pain in joint, shoulder region, right  Impaired functional mobility and endurance  Shoulder weakness      Subjective Assessment - 02/15/15 1407    Subjective Patient reports that her shoulder is improving. She has less intenst pain and had 1 1/2 days last week without pain. She still has limited functional activities with Rt UE. She can lift her arm to wash and comb her hair now.    Currently in Pain? Yes   Pain Score 3    Pain Location Shoulder   Pain Orientation Right   Pain Descriptors / Indicators Tightness;Aching   Pain Type Acute pain   Pain Frequency Intermittent            [facility] PT Assessment - 02/15/15 0001    Assessment   Medical Diagnosis R Shoulder pain   Onset Date/Surgical Date 03/10/13   Hand Dominance Right   AROM   AROM Assessment Site Shoulder   Right/Left Shoulder Right   Right Shoulder Extension 49 Degrees   Right Shoulder Flexion 150 Degrees   Right Shoulder ABduction 148 Degrees   Right Shoulder Internal Rotation 32 Degrees   Right Shoulder External Rotation 78 Degrees    PROM   Right Shoulder Flexion 162 Degrees   Right Shoulder ABduction 140 Degrees   Right Shoulder Internal Rotation 51 Degrees   Right Shoulder External Rotation 78 Degrees   Strength   Overall Strength Comments 5/5 bilat UE's pain with resisted Rt shd ER/IR                     [facility] Adult PT Treatment/Exercise - 02/15/15 0001    Neuro Re-ed    Neuro Re-ed Details  working on posture and alignment focus on activation of posterior shoulder girdle musculature    Exercises   Exercises Shoulder   Shoulder Exercises: Standing   External Rotation Strengthening;Both;10 reps;Theraband   Theraband Level (Shoulder External Rotation) Level 1 (Yellow)   Extension Strengthening;Both;10 reps;Theraband   Theraband Level (Shoulder Extension) Level 1 (Yellow)   Row Strengthening;Both;10 reps;Theraband   Theraband Level (Shoulder Row) Level 1 (Yellow)   Retraction --  scap squeeze with noodle 10 sec hold 10 reps    Shoulder Exercises: Stretch   Corner Stretch Limitations doorway 3 positioins 20-30 sec hold 3 each    Other Shoulder Stretches snow angel prolonged stretch 2-3 min shds at ~80 deg abd    Modalities  Modalities Electrical Stimulation;Cryotherapy   Cryotherapy   Number Minutes Cryotherapy 15 Minutes   Cryotherapy Location Shoulder   Type of Cryotherapy Ice pack   Electrical Stimulation   Electrical Stimulation Location Rt shoulder    Electrical Stimulation Action IFC   Electrical Stimulation Parameters to tolerance   Electrical Stimulation Goals Pain;Tone  muscular tightness    Manual Therapy   Manual therapy comments Pt supine working Rt shoudler    Joint Mobilization [place] joint mobs Grade II AP/caudal glides   Soft tissue mobilization pecs/upper trap/teres/leveator   Myofascial Release pecs/ant chest   Scapular Mobilization Rt lateral glides and working through anterior medical scapula   Passive ROM PROM Rt shoulder flex/abd/ER/ext - gentle                 PT Education - 02/15/15 1433    Education provided Yes   Education Details postural correction; HEP   Person(s) Educated Patient   Methods Explanation;Demonstration;Tactile cues;Verbal cues;Handout   Comprehension Verbalized understanding;Returned demonstration;Verbal cues required;Tactile cues required          PT Short Term Goals - 02/15/15 1450    PT SHORT TERM GOAL #1   Title pt independent with initial HEP by 02/10/15   Time 3   Period Weeks   Status On-going   PT SHORT TERM GOAL #2   Title Patient demo improved scapular posture and alignment for exercise/ADL's 02/10/15   Time 3   Period Weeks   Status On-going           PT Long Term Goals - 02/15/15 1450    PT LONG TERM GOAL #1   Title pt independent with advanced HEP for discharge 03/06/15   Time 6   Period Weeks   Status On-going   PT LONG TERM GOAL #2   Title R Shoulder AROM WFL without c/o pain by 03/06/15   Time 6   Period Weeks   Status On-going   PT LONG TERM GOAL #3   Title pt reports able to sleep without limitation by R shoulder pain by 03/06/15   Time 6   Period Weeks   Status Achieved   PT LONG TERM GOAL #4   Title pt able to perform ADLs, chores, and recreational activities with minimal to no restriction by R shoulder pain by 03/06/15   Time 6   Period Weeks   Status On-going   PT LONG TERM GOAL #5   Title Improve FOTO to </=36% limitation 03/06/15   Time 6   Period Weeks   Status On-going               Plan - 02/15/15 1446    Clinical Impression Statement [name] is progressing well with more gentle shoulder rehab. She demonstrates increased A/PROM Rt shoudler and improving postrue and alignment. She has continued tightness through pecs and anterior shouder. She has difficulty with consistent and corect HEP. She will benefit from continued PT to accomplish goals of therapy/    Pt will benefit from skilled therapeutic intervention in order to improve on the following deficits  Pain;Decreased mobility;Decreased strength;Postural dysfunction;Improper body mechanics;Impaired UE functional use;Decreased range of motion   Rehab Potential Good   PT Frequency 2x / week   PT Duration 6 weeks   PT Treatment/Interventions Dry needling;Taping;Vasopneumatic Device;Manual techniques;Patient/family education;Ultrasound;Therapeutic activities;Therapeutic exercise;Moist Heat;Electrical Stimulation;Cryotherapy;Iontophoresis 67m/ml Dexamethasone   PT Next Visit Plan scapular stability and posterior shoulder girdle and rotator cuff strengthening; manual and modalities for pain PRN   PT  Home Exercise Plan postural correction; HEP; myofacial ball release work    Oncologist with Plan of Care Patient        Problem List Patient Active Problem List   Diagnosis Date Noted  . Right shoulder pain 11/04/2014  . Degenerative joint disease of left hip 10/27/2014  . History of total left hip arthroplasty 10/27/2014  . Generalized anxiety disorder 03/28/2014  . Depression 03/28/2014  . Degenerative joint disease involving multiple joints 03/28/2014  . Raynaud disease 03/28/2014  . Seasonal allergies 03/28/2014  . Essential hypertension, benign 03/28/2014  . Seborrheic dermatitis 03/28/2014  . Hyperlipidemia 03/28/2014    Celyn Nilda Simmer PT, MPH  02/15/2015, 2:52 PM  [facility] [place], [place], [address] Phone: [phone]   Fax:  [phone]  Name: Jillian Stewart MRN: [id number] Date of Birth: Jul 10, 1955    PHYSICAL THERAPY DISCHARGE SUMMARY  Visits from Start of Care: 4  Current functional level related to goals / functional outcomes: See above   Remaining deficits: unknown   Education / Equipment: HEP Plan:                                                    Patient goals were partially met. Patient is being discharged due to being pleased with the current functional level.  ?????   Jeral Pinch, PT 02/27/2015 12:43 PM

## 2015-02-15 NOTE — Patient Instructions (Signed)
Shoulder Blade Squeeze   Can use swim noodle or door frame  Rotate shoulders back, then squeeze shoulder blades down and back  Hold 10 sec Repeat _10___ times. Do _several ___ sessions per day.  Resisted External Rotation: in Neutral - Bilateral   PALMS UP Sit or stand, tubing in both hands, elbows at sides, bent to 90, forearms forward. Pinch shoulder blades together and rotate forearms out. Keep elbows at sides. Repeat __10__ times per set. Do _2-3___ sets per session. Do _2-3___ sessions per day.   Low Row: Standing   Face anchor, feet shoulder width apart. Palms up, pull arms back, squeezing shoulder blades together. Repeat 10__ times per set. Do 2-3__ sets per session. Do 2-3__ sessions per week. Anchor Height: Waist     Strengthening: Resisted Extension   Hold tubing in right hand, arm forward. Pull arm back, elbow straight. Repeat _10___ times per set. Do 2-3____ sets per session. Do 2-3____ sessions per day.       Copyright  VHI. All rights reserved.

## 2015-02-21 ENCOUNTER — Encounter: Payer: 59 | Admitting: Rehabilitative and Restorative Service Providers"

## 2015-03-28 ENCOUNTER — Other Ambulatory Visit: Payer: Self-pay

## 2015-03-28 MED ORDER — DULOXETINE HCL 60 MG PO CPEP: 60.0000 mg | ORAL_CAPSULE | Freq: Every day | ORAL | Status: DC

## 2015-03-28 MED FILL — DULoxetine HCL 60 MG CPEP: 60 | 30 days supply | Qty: 30 | Fill #0

## 2015-03-30 ENCOUNTER — Ambulatory Visit: Payer: 59 | Admitting: Physician Assistant

## 2015-04-04 ENCOUNTER — Encounter: Payer: Self-pay | Admitting: Physician Assistant

## 2015-04-04 ENCOUNTER — Ambulatory Visit (INDEPENDENT_AMBULATORY_CARE_PROVIDER_SITE_OTHER): Payer: 59 | Admitting: Physician Assistant

## 2015-04-04 VITALS — BP 139/90 | HR 77 | Ht 63.0 in | Wt 135.0 lb

## 2015-04-04 DIAGNOSIS — I10 Essential (primary) hypertension: Secondary | ICD-10-CM

## 2015-04-04 DIAGNOSIS — F329 Major depressive disorder, single episode, unspecified: Secondary | ICD-10-CM | POA: Diagnosis not present

## 2015-04-04 DIAGNOSIS — K2100 Gastro-esophageal reflux disease with esophagitis, without bleeding: Secondary | ICD-10-CM

## 2015-04-04 DIAGNOSIS — T17320A Food in larynx causing asphyxiation, initial encounter: Secondary | ICD-10-CM

## 2015-04-04 DIAGNOSIS — Z87891 Personal history of nicotine dependence: Secondary | ICD-10-CM

## 2015-04-04 DIAGNOSIS — F32A Depression, unspecified: Secondary | ICD-10-CM

## 2015-04-04 MED ORDER — AMLODIPINE BESYLATE 5 MG PO TABS
5.0000 mg | ORAL_TABLET | Freq: Every day | ORAL | Status: DC
Start: 2015-04-04 — End: 2015-05-16

## 2015-04-04 MED ORDER — PITAVASTATIN CALCIUM 1 MG PO TABS: 1.0000 | ORAL_TABLET | Freq: Every day | ORAL | Status: DC

## 2015-04-04 MED ORDER — PANTOPRAZOLE SODIUM 40 MG PO TBEC: 40.0000 mg | DELAYED_RELEASE_TABLET | Freq: Every day | ORAL | Status: DC

## 2015-04-04 MED ORDER — DULOXETINE HCL 60 MG PO CPEP: 60.0000 mg | ORAL_CAPSULE | Freq: Every day | ORAL | Status: DC

## 2015-04-04 MED FILL — AMLODIPINE BESYLATE 5 MG TA: 5 | 90 days supply | Qty: 90 | Fill #0

## 2015-04-04 MED FILL — PANTOPRAZOLE SOD DR 40 MG T: 40 | 90 days supply | Qty: 90 | Fill #0

## 2015-04-04 MED FILL — LIVALO 1 MG TABLET: 1 | 30 days supply | Qty: 30 | Fill #0

## 2015-04-04 NOTE — Progress Notes (Signed)
   Subjective:    Patient ID: Jillian Stewart, female    DOB: February 20, 1956, 60 y.o.   MRN: [id number]  HPI  Patient is a 60 year old female who presents to the clinic to follow-up and get medication refills on hypertension and depression.  Hypertension-patient is taking Norvasc daily and doing well. Denies any chest pains, palpitations, headaches or dizziness.  Depression-patient was well-controlled on Cymbalta. She has been for the last couple years. She denies any concerns or complaints. She denies any suicidal or homicidal thoughts.  Patient has had some recent worsening of acid reflux. She is not on any daily preventative. When she takes them both for her shoulder it has Nexium in it. She does have a 15 year smoke history. She has had acid reflux for many years. It is normally controlled with diet. She is now having some choking issues. Last night she ate a pretzel and felt like the pretzel got caught in her throat. She drank water and was able to get it down. She does feel some indigestion and burning in her esophagus. Denies any epigastric or abdominal pain. Denies any nausea or vomiting.  Patient is not currently on anything for her cholesterol. Her last LDL was 180 when she was not on anything. All of the medications seem to cause intense muscle spasms and cramps. At some points to her so bad her toes curl. She has tried Lipitor, Zocor, Livalo, Zetia.      Review of Systems  All other systems reviewed and are negative.      Objective:   Physical Exam  Constitutional: She is oriented to person, place, and time. She appears well-developed and well-nourished.  HENT:  Head: Normocephalic and atraumatic.  Right Ear: External ear normal.  Left Ear: External ear normal.  Mouth/Throat: Oropharynx is clear and moist.  Neck: Normal range of motion. Neck supple. No thyromegaly present.  Cardiovascular: Normal rate, regular rhythm and normal heart sounds.   Pulmonary/Chest: Effort normal and  breath sounds normal. She has no wheezes.  Abdominal: Soft. Bowel sounds are normal. She exhibits no distension and no mass. There is no tenderness. There is no rebound and no guarding.  Lymphadenopathy:    She has no cervical adenopathy.  Neurological: She is alert and oriented to person, place, and time.  Psychiatric: She has a normal mood and affect. Her behavior is normal.          Assessment & Plan:  HTN- looks great. norvasc refilled for 1 year.   Depression- PHQ-9 was 1. Refilled cymbalta for 1 year.   GERD/former smoking/choking sensation- 15 plus hx of GERD off and on. Smoker for 8yrs but has quit. Taking vimovo but not every day. Start protonix and see if symptoms improve. Will order endoscopy. TSH normal in 11/2014.   Hyperlipidemia- discuss with patient maybe we can start livalo at 3 times a week and see if she has symptoms at that dose. I'm going to call the [employer] rep and see if she would be a candidate with coverage. Will check LDL after we get her on a medication regimen.

## 2015-04-04 NOTE — Patient Instructions (Signed)
ASA 81 mg

## 2015-04-06 ENCOUNTER — Other Ambulatory Visit: Payer: Self-pay | Admitting: Physician Assistant

## 2015-04-06 ENCOUNTER — Encounter: Payer: Self-pay | Admitting: Physician Assistant

## 2015-04-11 ENCOUNTER — Ambulatory Visit (INDEPENDENT_AMBULATORY_CARE_PROVIDER_SITE_OTHER): Payer: 59 | Admitting: Gastroenterology

## 2015-04-11 ENCOUNTER — Encounter: Payer: Self-pay | Admitting: Gastroenterology

## 2015-04-11 VITALS — BP 120/80 | HR 92 | Ht 62.75 in | Wt 138.4 lb

## 2015-04-11 DIAGNOSIS — Z1211 Encounter for screening for malignant neoplasm of colon: Secondary | ICD-10-CM

## 2015-04-11 DIAGNOSIS — R1310 Dysphagia, unspecified: Secondary | ICD-10-CM

## 2015-04-11 DIAGNOSIS — K219 Gastro-esophageal reflux disease without esophagitis: Secondary | ICD-10-CM | POA: Insufficient documentation

## 2015-04-11 DIAGNOSIS — R0989 Other specified symptoms and signs involving the circulatory and respiratory systems: Secondary | ICD-10-CM

## 2015-04-11 DIAGNOSIS — F458 Other somatoform disorders: Secondary | ICD-10-CM | POA: Diagnosis not present

## 2015-04-11 DIAGNOSIS — K5900 Constipation, unspecified: Secondary | ICD-10-CM | POA: Diagnosis not present

## 2015-04-11 NOTE — Progress Notes (Signed)
HPI :  60 y/o female with PMH as outlined below here for new patient evaluation for symptoms of GERD, globus, and dysphagia.  Patient reports a history of heartburn, which has historically been mild. She has had some nocturnal pyrosis symptoms which wake her from sleep at times, which improve with TUMS. She does endorse some reflux after she eats, some particular foods can precipitate her symptoms at times.She has been trying to cut back on alcohol which can make symptoms worse. She can have a sense of globus, feels like a lump or knot in her throat, which can come and go. She has some nausea but no vomiting. The nausea is sporadic and not always related to eating. She has had some mild dysphagia ongoing for the past 3-6 months or so. She thinks this most commonly occurs to pills, although some dry foods as well, but not liquids. No abdominal pains. No blood in the stools. She has just started protonix 40mg  about 3 days ago and thinks so far she has felt better in regards to these symptoms. She has tried some probiotics for these issues. She has joint pains that bother her, typically shoulder, she has taken naproxen PRN.   She has a 15 year tobacco history, but not currently smoking.   She had a prior EGD which showed "gastritis", she thinks around 10 years ago, no records available today.  She reports her father had a history of colon polyps. She thinks she had colon polyps a very long time ago, she thinks about 10 years ago. She had a 1 year follow up, then 3 year, and then has been having 5 year recalls since then by her previous GI physician.   She has history of constipation which is chronic. She takes miralax routinely for constipation. Miralax tends to work for her constipation.   Colonoscopy 08/26/11 - normal exam, no polyps  Past Medical History  Diagnosis Date  . Hyperlipidemia   . Hypertension   . Colon polyps   . Depression   . Anxiety   . Arthritis   . GERD (gastroesophageal  reflux disease)   . Chronic constipation      Past Surgical History  Procedure Laterality Date  . Rhinoplasty    . Ovarian cyst surgery Left   . Breast surgery Right   . Hip surgery      right and left.     Family History  Problem Relation Age of Onset  . Cancer Mother     cholangiocarcinoma  . Stroke Father   . Diabetes Maternal Grandmother    Social History  Substance Use Topics  . Smoking status: Former Smoker    Types: Cigarettes    Quit date: 03/13/2003  . Smokeless tobacco: Never Used  . Alcohol Use: 0.6 - 1.2 oz/week    1-2 Cans of beer per week   Current Outpatient Prescriptions  Medication Sig Dispense Refill  . amLODipine (NORVASC) 5 MG tablet Take 1 tablet (5 mg total) by mouth daily. 90 tablet 4  . cholecalciferol (VITAMIN D) 1000 units tablet Take 1,000 Units by mouth daily.    . DULoxetine (CYMBALTA) 60 MG capsule Take 1 capsule (60 mg total) by mouth daily. 90 capsule 4  . ipratropium (ATROVENT) 0.06 % nasal spray Place 2 sprays into both nostrils 4 (four) times daily. 15 mL 1  . Multiple Vitamins-Minerals (MULTIVITAMINS) CHEW Chew 1 tablet by mouth daily.    . Naproxen-Esomeprazole 375-20 MG TBEC Take 1 tablet twice a  day. 60 tablet 1  . pantoprazole (PROTONIX) 40 MG tablet Take 1 tablet (40 mg total) by mouth daily. 30 tablet 2  . Pitavastatin Calcium 1 MG TABS Take 1 tablet (1 mg total) by mouth daily. 30 tablet 5  . Probiotic Product ([employer] PROBIOTIC) CHEW Chew 1 tablet by mouth daily.     No current facility-administered medications for this visit.   Allergies  Allergen Reactions  . Statins Other (See Comments)    Severe muscle cramps.  Pravastatin, Simvastatin, Atorvastatin, & Livalo     Review of Systems: All systems reviewed and negative except where noted in HPI.   Lab Results  Component Value Date   WBC 3.6 12/04/2013   HGB 14.3 12/04/2013   HCT 42 12/04/2013   PLT 232 12/04/2013    Lab Results  Component Value Date   CREATININE  0.72 11/17/2014   BUN 15 11/17/2014   NA 139 11/17/2014   K 4.3 11/17/2014   CL 102 11/17/2014   CO2 28 11/17/2014    Lab Results  Component Value Date   ALT 17 11/17/2014   AST 24 11/17/2014   ALKPHOS 62 11/17/2014   BILITOT 0.6 11/17/2014     Physical Exam: BP 120/80 mmHg  Pulse 92  Ht 5' 2.75" (1.594 m)  Wt 138 lb 6 oz (62.766 kg)  BMI 24.70 kg/m2 Constitutional: Pleasant,well-developed, female in no acute distress. HEENT: Normocephalic and atraumatic. Conjunctivae are normal. No scleral icterus. Neck supple.  Cardiovascular: Normal rate, regular rhythm.  Pulmonary/chest: Effort normal and breath sounds normal. No wheezing, rales or rhonchi. Abdominal: Soft, nondistended, nontender. Bowel sounds active throughout. There are no masses palpable. No hepatomegaly. Extremities: no edema Lymphadenopathy: No cervical adenopathy noted. Neurological: Alert and oriented to person place and time. Skin: Skin is warm and dry. No rashes noted. Psychiatric: Normal mood and affect. Behavior is normal.   ASSESSMENT AND PLAN: 60 y/o female with PMH as outlined above presenting with GERD, intermittent dysphagia, and globus. Also with history of chronic constipation.   GERD / dysphagia / globus - history as above. Agree with continuing protonix 40mg  daily for trial of 4 weeks and see how she responds. I suspect she will feel much better on this regimen. Moving forward if it helps she can take it as needed. I suspect globus is likely related to reflux, also with her dysphagia, dysmotility is also possible. I offered her an EGD to evaluate her dysphagia and globus otherwise, given her history of tobacco use, and can also screen for BE. The indications, risks, and benefits of EGD were explained to the patient in detail. Risks include but are not limited to bleeding, perforation, adverse reaction to medications, and cardiopulmonary compromise. Sequelae include but are not limited to the possibility  of surgery, hospitalization, and mortality. The patient verbalized understanding and wished to proceed. All questions answered, referred to scheduler. Further recommendations pending results of the exam. If EGD is negative and dysphagia persists, will proceed with manometry.  Constipation - miralax working well for her, she should continue it as needed  CRC screening - I don't have prior records of remote colonoscopy, suspect she had a higher risk lesion if she warranted follow up one year later and then 3 years, and we can obtain these records. Now on every 5 years surveillance schedule per prior GI physician, last exam was normal. We discussed lengthening her surveillance to 10 years which she was quite anxious about. She had in-laws with a recent diagnosis of  colon cancer after a normal exam 10 years prior. For piece of mind we can perform her next exam in 5 years after her last, however moving forward if it is normal, would recommend lengthening the surveillance interval if appropriate   Gridley Cellar, MD [facility] Pager [phone]  CC: Donella Stade, PA-C

## 2015-04-11 NOTE — Patient Instructions (Signed)

## 2015-04-23 MED FILL — AMOXICILLIN 500 MG CAPSULE: 500 | 3 days supply | Qty: 12 | Fill #0

## 2015-04-25 ENCOUNTER — Other Ambulatory Visit: Payer: Self-pay

## 2015-04-25 DIAGNOSIS — K219 Gastro-esophageal reflux disease without esophagitis: Secondary | ICD-10-CM

## 2015-05-01 MED FILL — DULoxetine HCL 60 MG CPEP: 60 | 90 days supply | Qty: 90 | Fill #0

## 2015-05-16 ENCOUNTER — Encounter: Payer: Self-pay | Admitting: Gastroenterology

## 2015-05-16 ENCOUNTER — Ambulatory Visit (AMBULATORY_SURGERY_CENTER): Payer: 59 | Admitting: Gastroenterology

## 2015-05-16 VITALS — BP 135/75 | HR 64 | Temp 97.8°F | Resp 12 | Ht 62.0 in | Wt 138.0 lb

## 2015-05-16 DIAGNOSIS — R1310 Dysphagia, unspecified: Secondary | ICD-10-CM

## 2015-05-16 DIAGNOSIS — K219 Gastro-esophageal reflux disease without esophagitis: Secondary | ICD-10-CM | POA: Diagnosis not present

## 2015-05-16 DIAGNOSIS — K2980 Duodenitis without bleeding: Secondary | ICD-10-CM | POA: Diagnosis not present

## 2015-05-16 DIAGNOSIS — I10 Essential (primary) hypertension: Secondary | ICD-10-CM | POA: Diagnosis not present

## 2015-05-16 DIAGNOSIS — K2950 Unspecified chronic gastritis without bleeding: Secondary | ICD-10-CM | POA: Diagnosis not present

## 2015-05-16 DIAGNOSIS — F341 Dysthymic disorder: Secondary | ICD-10-CM | POA: Diagnosis not present

## 2015-05-16 MED ORDER — SODIUM CHLORIDE 0.9 % IV SOLN: 500.0000 mL | INTRAVENOUS | Status: DC

## 2015-05-16 NOTE — Op Note (Signed)
Patient Name: Jillian Stewart Procedure Date: 05/16/2015 1:31 PM MRN: [id number] Endoscopist: Remo Lipps P. Havery Moros , MD Age: 60 Referring MD:  Date of Birth: 09-Oct-1955 Gender: Female Procedure:            Upper GI endoscopy Indications:          Dysphagia, Heartburn, Globus sensation Medicines:            Monitored Anesthesia Care Procedure:            Pre-Anesthesia Assessment:                       - Prior to the procedure, a History and Physical was                        performed, and patient medications and allergies were                        reviewed. The patient's tolerance of previous                        anesthesia was also reviewed. The risks and benefits of                        the procedure and the sedation options and risks were                        discussed with the patient. All questions were                        answered, and informed consent was obtained. Prior                        Anticoagulants: The patient has taken no previous                        anticoagulant or antiplatelet agents. ASA Grade                        Assessment: II - A patient with mild systemic disease.                        After reviewing the risks and benefits, the patient was                        deemed in satisfactory condition to undergo the                        procedure.                       After obtaining informed consent, the endoscope was                        passed under direct vision. Throughout the procedure,                        the patient's blood pressure, pulse, and oxygen                        saturations were monitored continuously. The Model  [id number]) scope was introduced through the                        mouth, and advanced to the second part of duodenum. The                        upper GI endoscopy was accomplished without difficulty.                        The patient tolerated the procedure well. Scope  In: Scope Out: Findings:      Esophagogastric landmarks were identified: the Z-line was found at 39       cm, the gastroesophageal junction was found at 39 cm and the site of       hiatal narrowing was found at 39 cm from the incisors. No esophagitis       was noted. No stenosis or stricture.      A single area of ectopic gastric mucosa was found in the upper third of       the esophagus.      Patchy mild inflammation characterized by erythema was found in the       entire examined stomach without ulceration or erosion. Biopsies were       taken with a cold forceps for histology. Estimated blood loss was       minimal.      Patchy mildly erythematous mucosa without active bleeding and with no       stigmata of bleeding was found in the duodenal bulb. This was biopsied       with a cold forceps for histology. Estimated blood loss was minimal.      The exam was otherwise without abnormality. Complications:        No immediate complications. Estimated blood loss:                        Minimal. Estimated Blood Loss: Estimated blood loss was minimal. Impression:           - Esophagogastric landmarks identified.                       - Ectopic gastric mucosa in the upper third of the                        esophagus. Normal esophagus otherwise                       - Nonerosive Gastritis. Biopsied.                       - Erythematous duodenopathy. Biopsied.                       - The examination was otherwise normal. Recommendation:       - Patient has a contact number available for                        emergencies. The signs and symptoms of potential                        delayed complications were discussed with the patient.  Return to normal activities tomorrow. Written discharge                        instructions were provided to the patient.                       - Resume previous diet.                       - Continue present medications.                        - Await pathology results.                       - No repeat upper endoscopy. Procedure Code(s):    --- Professional ---                       [phone], Esophagogastroduodenoscopy, flexible, transoral;                        with biopsy, single or multiple CPT copyright 2016 [employer]. All rights reserved. Remo Lipps P. Havery Moros, MD Carlota Raspberry. Alejo Beamer, MD 05/16/2015 1:53:17 PM This report has been signed electronically. Number of Addenda: 0

## 2015-05-16 NOTE — Patient Instructions (Signed)
YOU HAD AN ENDOSCOPIC PROCEDURE TODAY AT [facility]:   Refer to the procedure report that was given to you for any specific questions about what was found during the examination.  If the procedure report does not answer your questions, please call your gastroenterologist to clarify.  If you requested that your care partner not be given the details of your procedure findings, then the procedure report has been included in a sealed envelope for you to review at your convenience later.  YOU SHOULD EXPECT: Some feelings of bloating in the abdomen. Passage of more gas than usual.  Walking can help get rid of the air that was put into your GI tract during the procedure and reduce the bloating. If you had a lower endoscopy (such as a colonoscopy or flexible sigmoidoscopy) you may notice spotting of blood in your stool or on the toilet paper. If you underwent a bowel prep for your procedure, you may not have a normal bowel movement for a few days.  Please Note:  You might notice some irritation and congestion in your nose or some drainage.  This is from the oxygen used during your procedure.  There is no need for concern and it should clear up in a day or so.  SYMPTOMS TO REPORT IMMEDIATELY:   Following upper endoscopy (EGD)  Vomiting of blood or coffee ground material  New chest pain or pain under the shoulder blades  Painful or persistently difficult swallowing  New shortness of breath  Fever of 100F or higher  Black, tarry-looking stools  For urgent or emergent issues, a gastroenterologist can be reached at any hour by calling [phone].   DIET: Your first meal following the procedure should be a small meal and then it is ok to progress to your normal diet. Heavy or fried foods are harder to digest and may make you feel nauseous or bloated.  Likewise, meals heavy in dairy and vegetables can increase bloating.  Drink plenty of fluids but you should avoid alcoholic beverages for  24 hours.  ACTIVITY:  You should plan to take it easy for the rest of today and you should NOT DRIVE or use heavy machinery until tomorrow (because of the sedation medicines used during the test).    FOLLOW UP: Our staff will call the number listed on your records the next business day following your procedure to check on you and address any questions or concerns that you may have regarding the information given to you following your procedure. If we do not reach you, we will leave a message.  However, if you are feeling well and you are not experiencing any problems, there is no need to return our call.  We will assume that you have returned to your regular daily activities without incident.  If any biopsies were taken you will be contacted by phone or by letter within the next 1-3 weeks.  Please call us at [phone] if you have not heard about the biopsies in 3 weeks.    SIGNATURES/CONFIDENTIALITY: You and/or your care partner have signed paperwork which will be entered into your electronic medical record.  These signatures attest to the fact that that the information above on your After Visit Summary has been reviewed and is understood.  Full responsibility of the confidentiality of this discharge information lies with you and/or your care-partner.  Resume your previous medications and diet Await pathology results

## 2015-05-16 NOTE — Progress Notes (Signed)
Called to room to assist during endoscopic procedure.  Patient ID and intended procedure confirmed with present staff. Received instructions for my participation in the procedure from the performing physician.  

## 2015-05-16 NOTE — Progress Notes (Signed)
Stable to RR 

## 2015-05-17 ENCOUNTER — Telehealth: Payer: Self-pay | Admitting: *Deleted

## 2015-05-17 NOTE — Telephone Encounter (Signed)
Left message on f/u call 

## 2015-05-30 ENCOUNTER — Telehealth: Payer: Self-pay

## 2015-05-30 NOTE — Telephone Encounter (Signed)
Error

## 2015-06-01 ENCOUNTER — Other Ambulatory Visit: Payer: Self-pay | Admitting: Physician Assistant

## 2015-06-01 DIAGNOSIS — Z1231 Encounter for screening mammogram for malignant neoplasm of breast: Secondary | ICD-10-CM

## 2015-06-05 ENCOUNTER — Ambulatory Visit: Payer: 59 | Admitting: Family Medicine

## 2015-06-13 ENCOUNTER — Ambulatory Visit: Payer: 59

## 2015-06-28 MED FILL — AMLODIPINE BESYLATE 5 MG TA: 5 | 90 days supply | Qty: 90 | Fill #1

## 2015-07-04 ENCOUNTER — Ambulatory Visit (INDEPENDENT_AMBULATORY_CARE_PROVIDER_SITE_OTHER): Payer: 59

## 2015-07-04 DIAGNOSIS — Z1231 Encounter for screening mammogram for malignant neoplasm of breast: Secondary | ICD-10-CM | POA: Diagnosis not present

## 2015-07-16 MED FILL — LIVALO 1 MG TABLET: 1 | 30 days supply | Qty: 30 | Fill #1

## 2015-07-31 MED FILL — DULoxetine HCL 60 MG CPEP: 60 | 90 days supply | Qty: 90 | Fill #1

## 2015-08-30 DIAGNOSIS — I781 Nevus, non-neoplastic: Secondary | ICD-10-CM | POA: Diagnosis not present

## 2015-08-30 DIAGNOSIS — L821 Other seborrheic keratosis: Secondary | ICD-10-CM | POA: Diagnosis not present

## 2015-08-30 DIAGNOSIS — L814 Other melanin hyperpigmentation: Secondary | ICD-10-CM | POA: Diagnosis not present

## 2015-08-30 DIAGNOSIS — D225 Melanocytic nevi of trunk: Secondary | ICD-10-CM | POA: Diagnosis not present

## 2015-08-30 DIAGNOSIS — Z1283 Encounter for screening for malignant neoplasm of skin: Secondary | ICD-10-CM | POA: Diagnosis not present

## 2015-10-02 MED FILL — AMLODIPINE BESYLATE 5 MG TA: 5 | 90 days supply | Qty: 90 | Fill #2

## 2015-10-26 MED FILL — DULoxetine HCL 60 MG CPEP: 60 | 90 days supply | Qty: 90 | Fill #2

## 2015-11-16 MED FILL — AMOXICILLIN 500 MG CAPSULE: 500 | 3 days supply | Qty: 12 | Fill #1

## 2015-12-26 ENCOUNTER — Encounter: Payer: 59 | Admitting: Obstetrics & Gynecology

## 2015-12-31 MED FILL — AMLODIPINE BESYLATE 5 MG TA: 5 | 90 days supply | Qty: 90 | Fill #3

## 2016-01-07 ENCOUNTER — Ambulatory Visit (INDEPENDENT_AMBULATORY_CARE_PROVIDER_SITE_OTHER): Payer: 59 | Admitting: Sports Medicine

## 2016-01-07 ENCOUNTER — Encounter: Payer: Self-pay | Admitting: Sports Medicine

## 2016-01-07 DIAGNOSIS — J019 Acute sinusitis, unspecified: Secondary | ICD-10-CM | POA: Diagnosis not present

## 2016-01-07 MED ORDER — FLUTICASONE PROPIONATE 50 MCG/ACT NA SUSP: NASAL | 3 refills | Status: DC

## 2016-01-07 MED ORDER — AZITHROMYCIN 250 MG PO TABS: ORAL_TABLET | ORAL | 0 refills | Status: DC

## 2016-01-07 MED FILL — AZITHROMYCIN 250 MG TABLET: 250 | 5 days supply | Qty: 6 | Fill #0

## 2016-01-07 MED FILL — FLUTICASONE PROP 50 MCG SPR: 50 | 84 days supply | Qty: 48 | Fill #0

## 2016-01-07 NOTE — Assessment & Plan Note (Signed)
Azithromycin, Flonase. Did have sinus surgery in the past.

## 2016-01-07 NOTE — Patient Instructions (Signed)

## 2016-01-07 NOTE — Progress Notes (Signed)
  Subjective:    CC: Sinus pressure  HPI: This is a pleasant 60 year old female, she comes in with over a week of pain and pressure over her frontal and maxillary sinuses with a sore throat. Mild cough, symptoms are mild, persistent. She does have a history of chronic sinusitis, and is post sinus surgery. Oropharynx, nasopharynx, ear canals unremarkable, only minimal tenderness over the maxillary sinuses.  Past medical history:  Negative.  See flowsheet/record as well for more information.  Surgical history: Negative.  See flowsheet/record as well for more information.  Family history: Negative.  See flowsheet/record as well for more information.  Social history: Negative.  See flowsheet/record as well for more information.  Allergies, and medications have been entered into the medical record, reviewed, and no changes needed.   Review of Systems: No fevers, chills, night sweats, weight loss, chest pain, or shortness of breath.   Objective:    General: Well Developed, well nourished, and in no acute distress.  Neuro: Alert and oriented x3, extra-ocular muscles intact, sensation grossly intact.  HEENT: Normocephalic, atraumatic, pupils equal round reactive to light, neck supple, no masses, no lymphadenopathy, thyroid nonpalpable.  Skin: Warm and dry, no rashes. Cardiac: Regular rate and rhythm, no murmurs rubs or gallops, no lower extremity edema.  Respiratory: Clear to auscultation bilaterally. Not using accessory muscles, speaking in full sentences.  Impression and Recommendations:    Acute sinus infection Azithromycin, Flonase. Did have sinus surgery in the past.

## 2016-01-11 ENCOUNTER — Telehealth: Payer: Self-pay

## 2016-01-11 NOTE — Telephone Encounter (Signed)
Pt left VM stating she was given a Z-Pak for a sinus infection but doesn't feel like it's done anything. Pt also stated had a temperature of 100.4 yesterday. Would like to know what to do next. Please advise.

## 2016-01-11 NOTE — Telephone Encounter (Signed)
This would indicate that the infection is still viral, antibiotics will not touch a virus, the bacteria are now dead post azithromycin. Continue doing the nasal steroid spray every day, twice a day. Her body will need to fight this off.

## 2016-01-21 ENCOUNTER — Ambulatory Visit: Payer: 59 | Admitting: Family Medicine

## 2016-01-24 ENCOUNTER — Ambulatory Visit (INDEPENDENT_AMBULATORY_CARE_PROVIDER_SITE_OTHER): Payer: 59 | Admitting: Obstetrics & Gynecology

## 2016-01-24 ENCOUNTER — Encounter: Payer: Self-pay | Admitting: Obstetrics & Gynecology

## 2016-01-24 VITALS — BP 114/78 | HR 79 | Ht 63.0 in | Wt 135.0 lb

## 2016-01-24 DIAGNOSIS — Z23 Encounter for immunization: Secondary | ICD-10-CM

## 2016-01-24 DIAGNOSIS — Z01419 Encounter for gynecological examination (general) (routine) without abnormal findings: Secondary | ICD-10-CM | POA: Diagnosis not present

## 2016-01-24 DIAGNOSIS — Z124 Encounter for screening for malignant neoplasm of cervix: Secondary | ICD-10-CM | POA: Diagnosis not present

## 2016-01-24 DIAGNOSIS — Z1151 Encounter for screening for human papillomavirus (HPV): Secondary | ICD-10-CM

## 2016-01-24 MED ORDER — ESTRADIOL 10 MCG VA TABS: ORAL_TABLET | VAGINAL | 12 refills | Status: DC

## 2016-01-24 NOTE — Progress Notes (Signed)
Subjective:    Jillian Stewart is a 60 y.o. MW P3 (98, 93, and 79 yo kids, 5 grands) female who presents for an annual exam. The patient has no complaints today. She has bad vaginal dryness. Uses vaginal lubricants, tried vaginal estrogen. The patient is sexually active. GYN screening history: last pap: was normal. The patient wears seatbelts: yes. The patient participates in regular exercise: yes. Has the patient ever been transfused or tattooed?: no. The patient reports that there is not domestic violence in her life.   Menstrual History: OB History    Gravida Para Term Preterm AB Living   3 3 3     3    SAB TAB Ectopic Multiple Live Births           3      Menarche age: 78 No LMP recorded. Patient is postmenopausal.    The following portions of the patient's history were reviewed and updated as appropriate: allergies, current medications, past family history, past medical history, past social history, past surgical history and problem list.  Review of Systems Pertinent items are noted in HPI.   FH- no breast/gyn/colon cancer Colonoscopy due this year Married for 40 years Works from home, Secondary school teacher   Objective:    BP 114/78   Pulse 79   Ht 5\' 3"  (1.6 m)   Wt 135 lb (61.2 kg)   BMI 23.91 kg/m   General Appearance:    Alert, cooperative, no distress, appears stated age  Head:    Normocephalic, without obvious abnormality, atraumatic  Eyes:    PERRL, conjunctiva/corneas clear, EOM's intact, fundi    benign, both eyes  Ears:    Normal TM's and external ear canals, both ears  Nose:   Nares normal, septum midline, mucosa normal, no drainage    or sinus tenderness  Throat:   Lips, mucosa, and tongue normal; teeth and gums normal  Neck:   Supple, symmetrical, trachea midline, no adenopathy;    thyroid:  no enlargement/tenderness/nodules; no carotid   bruit or JVD  Back:     Symmetric, no curvature, ROM normal, no CVA tenderness  Lungs:     Clear to auscultation bilaterally,  respirations unlabored  Chest Wall:    No tenderness or deformity   Heart:    Regular rate and rhythm, S1 and S2 normal, no murmur, rub   or gallop  Breast Exam:    No tenderness, masses, or nipple abnormality  Abdomen:     Soft, non-tender, bowel sounds active all four quadrants,    no masses, no organomegaly  Genitalia:    Severe vulvo vaginal atrophy, small dark lesion at the introitus on the right, NSSmid plane, NT, no adnexal masses     Extremities:   Extremities normal, atraumatic, no cyanosis or edema  Pulses:   2+ and symmetric all extremities  Skin:   Skin color, texture, turgor normal, no rashes or lesions  Lymph nodes:   Cervical, supraclavicular, and axillary nodes normal  Neurologic:   CNII-XII intact, normal strength, sensation and reflexes    throughout   .    Assessment:    Healthy female exam.   Vulvar lesion V V A   Plan:     Breast self exam technique reviewed and patient encouraged to perform self-exam monthly. Mammogram.   Thin prep pap with cotesting Schedule vulvar biopsy vagifem

## 2016-01-25 ENCOUNTER — Ambulatory Visit (INDEPENDENT_AMBULATORY_CARE_PROVIDER_SITE_OTHER): Payer: 59

## 2016-01-25 ENCOUNTER — Other Ambulatory Visit: Payer: 59

## 2016-01-25 DIAGNOSIS — Z1231 Encounter for screening mammogram for malignant neoplasm of breast: Secondary | ICD-10-CM

## 2016-01-25 DIAGNOSIS — Z01419 Encounter for gynecological examination (general) (routine) without abnormal findings: Secondary | ICD-10-CM | POA: Diagnosis not present

## 2016-01-25 LAB — COMPREHENSIVE METABOLIC PANEL
ALT: 14 U/L (ref 6–29)
AST: 22 U/L (ref 10–35)
Albumin: 4.1 g/dL (ref 3.6–5.1)
Alkaline Phosphatase: 54 U/L (ref 33–130)
BILIRUBIN TOTAL: 0.5 mg/dL (ref 0.2–1.2)
BUN: 18 mg/dL (ref 7–25)
CALCIUM: 9.1 mg/dL (ref 8.6–10.4)
CO2: 25 mmol/L (ref 20–31)
Chloride: 104 mmol/L (ref 98–110)
Creat: 0.68 mg/dL (ref 0.50–0.99)
GLUCOSE: 95 mg/dL (ref 65–99)
Potassium: 4.4 mmol/L (ref 3.5–5.3)
Sodium: 139 mmol/L (ref 135–146)
Total Protein: 6.5 g/dL (ref 6.1–8.1)

## 2016-01-25 LAB — TSH: TSH: 2.13 m[IU]/L

## 2016-01-25 LAB — CBC
HEMATOCRIT: 40.2 % (ref 35.0–45.0)
HEMOGLOBIN: 13.3 g/dL (ref 11.7–15.5)
MCH: 29.8 pg (ref 27.0–33.0)
MCHC: 33.1 g/dL (ref 32.0–36.0)
MCV: 90.1 fL (ref 80.0–100.0)
MPV: 9.2 fL (ref 7.5–12.5)
Platelets: 276 10*3/uL (ref 140–400)
RBC: 4.46 MIL/uL (ref 3.80–5.10)
RDW: 13.8 % (ref 11.0–15.0)
WBC: 4.9 10*3/uL (ref 3.8–10.8)

## 2016-01-25 LAB — LIPID PANEL
Cholesterol: 255 mg/dL — ABNORMAL HIGH (ref <200)
HDL: 75 mg/dL (ref >50)
LDL Cholesterol: 166 mg/dL — ABNORMAL HIGH (ref <100)
Total CHOL/HDL Ratio: 3.4 Ratio (ref <5.0)
Triglycerides: 71 mg/dL (ref <150)
VLDL: 14 mg/dL (ref <30)

## 2016-01-26 LAB — VITAMIN D 25 HYDROXY (VIT D DEFICIENCY, FRACTURES): VIT D 25 HYDROXY: 42 ng/mL (ref 30–100)

## 2016-01-28 ENCOUNTER — Telehealth: Payer: Self-pay | Admitting: *Deleted

## 2016-01-28 LAB — CYTOLOGY - PAP
Diagnosis: NEGATIVE
HPV: NOT DETECTED

## 2016-01-28 NOTE — Telephone Encounter (Signed)
A copy of labs mailed to pt's home address with instructions to follow up with her PCP regarding her Lipids.

## 2016-01-30 MED FILL — DULoxetine HCL 60 MG CPEP: 60 | 90 days supply | Qty: 90 | Fill #3

## 2016-02-11 ENCOUNTER — Encounter: Payer: 59 | Admitting: Physician Assistant

## 2016-02-15 ENCOUNTER — Ambulatory Visit: Payer: 59 | Admitting: Family

## 2016-02-19 ENCOUNTER — Ambulatory Visit (INDEPENDENT_AMBULATORY_CARE_PROVIDER_SITE_OTHER): Payer: 59 | Admitting: Physician Assistant

## 2016-02-19 ENCOUNTER — Encounter: Payer: Self-pay | Admitting: Physician Assistant

## 2016-02-19 VITALS — BP 128/75 | HR 76 | Ht 63.0 in | Wt 136.0 lb

## 2016-02-19 DIAGNOSIS — Z0000 Encounter for general adult medical examination without abnormal findings: Secondary | ICD-10-CM | POA: Diagnosis not present

## 2016-02-19 DIAGNOSIS — F330 Major depressive disorder, recurrent, mild: Secondary | ICD-10-CM

## 2016-02-19 DIAGNOSIS — M25511 Pain in right shoulder: Secondary | ICD-10-CM

## 2016-02-19 DIAGNOSIS — R911 Solitary pulmonary nodule: Secondary | ICD-10-CM | POA: Insufficient documentation

## 2016-02-19 DIAGNOSIS — I10 Essential (primary) hypertension: Secondary | ICD-10-CM

## 2016-02-19 DIAGNOSIS — M7501 Adhesive capsulitis of right shoulder: Secondary | ICD-10-CM

## 2016-02-19 DIAGNOSIS — E782 Mixed hyperlipidemia: Secondary | ICD-10-CM | POA: Diagnosis not present

## 2016-02-19 DIAGNOSIS — G8929 Other chronic pain: Secondary | ICD-10-CM

## 2016-02-19 MED ORDER — NAPROXEN-ESOMEPRAZOLE 375-20 MG PO TBEC: DELAYED_RELEASE_TABLET | ORAL | 11 refills | Status: DC

## 2016-02-19 MED ORDER — DULOXETINE HCL 60 MG PO CPEP: 60.0000 mg | ORAL_CAPSULE | Freq: Every day | ORAL | 3 refills | Status: DC

## 2016-02-19 MED ORDER — COLESEVELAM HCL 625 MG PO TABS: 1875.0000 mg | ORAL_TABLET | Freq: Two times a day (BID) | ORAL | 5 refills | Status: DC

## 2016-02-19 MED ORDER — AMLODIPINE BESYLATE 5 MG PO TABS: 5.0000 mg | ORAL_TABLET | Freq: Every day | ORAL | 3 refills | Status: DC

## 2016-02-19 MED FILL — WELCHOL 625 MG TABLET: 625 | 30 days supply | Qty: 180 | Fill #0

## 2016-02-19 MED FILL — VIMOVO DR 375-20 MG TABLET: 375-20 | 30 days supply | Qty: 60 | Fill #0

## 2016-02-19 NOTE — Patient Instructions (Signed)

## 2016-02-19 NOTE — Progress Notes (Addendum)
Subjective:    Patient ID: Jillian Stewart, female    DOB: January 12, 1956, 60 y.o.   MRN: [id number]  HPI Patient is a 60 yo female coming in today for a physical today. Patient complains of right shoulder pain. She was given steroid injections in the past through Dr. Georgina Snell, her last one was a year ago. She reports she has been taking Aleve every third day for pain. She was taking naproxen-esomeprazole; however, she only has one pill left. She does feel like vimovo works well.   Patient is concerned about her cholesterol and wants to know what she can take in order to lower her cholesterol levels. She reports that she had muscle aches and cramping with stain therapy and also experienced muscle aches with Zeita.   Patient mentioned that she had a series of three CT scans five years ago through her pulmonologist in [place] due to finding pulmonary nodule/nodules. Patient wants to know if she needs to have another CT scan. Patient is a previous smoker with a 17 pack-year history.   Patient reports that she she is having a biopsy in a week of an area on labia that her GYN thought looked suspious. next Tuesday.   Patient requests refills on Norvasc, naproxen-esomeprazole and Cymbalta.   Past Medical History:  Diagnosis Date  . Anxiety   . Arthritis   . Chronic constipation   . Colon polyps   . Depression   . GERD (gastroesophageal reflux disease)   . Hyperlipidemia   . Hypertension    Current Outpatient Prescriptions on File Prior to Visit  Medication Sig Dispense Refill  . cholecalciferol (VITAMIN D) 1000 units tablet Take 1,000 Units by mouth daily.    . Estradiol 10 MCG TABS vaginal tablet 1 tablet qhs for 2 weeks, then 3 times per week 30 tablet 12  . fluticasone (FLONASE) 50 MCG/ACT nasal spray One spray in each nostril twice a day, use left hand for right nostril, and right hand for left nostril. 48 g 3  . ipratropium (ATROVENT) 0.06 % nasal spray Place 2 sprays into both nostrils 4  (four) times daily. 15 mL 1  . Multiple Vitamins-Minerals (MULTIVITAMINS) CHEW Chew 1 tablet by mouth daily.    . pantoprazole (PROTONIX) 40 MG tablet Take 1 tablet (40 mg total) by mouth daily. 30 tablet 2   No current facility-administered medications on file prior to visit.    Allergies  Allergen Reactions  . Statins Other (See Comments)    Severe muscle cramps.  Pravastatin, Simvastatin, Atorvastatin, & Livalo  . Zetia [Ezetimibe]     Muscle cramps   Social History   Social History  . Marital status: Married    Spouse name: N/A  . Number of children: 3   . Years of education: N/A   Occupational History  . retired    Social History Main Topics  . Smoking status: Former Smoker    Types: Cigarettes    Quit date: 03/13/2003  . Smokeless tobacco: Never Used  . Alcohol use 0.6 - 1.2 oz/week    1 - 2 Cans of beer per week  . Drug use: No  . Sexual activity: Yes   Other Topics Concern  . Not on file   Social History Narrative   Walks for exercise daily. 3 daughters.      Review of Systems  All other systems reviewed and are negative.      Objective: Blood pressure 128/75, pulse 76, height 5\' 3"  (1.6  m), weight 136 lb (61.7 kg).   Physical Exam  Constitutional: She is oriented to person, place, and time. She appears well-developed and well-nourished. No distress.  HENT:  Head: Normocephalic and atraumatic.  Right Ear: External ear normal.  Left Ear: External ear normal.  Mouth/Throat: Oropharynx is clear and moist. No oropharyngeal exudate.  Tympanic membranes visualized bilaterally.   Eyes: EOM are normal. Pupils are equal, round, and reactive to light.  Neck: Normal range of motion. Neck supple. No thyromegaly present.  Cardiovascular: Normal rate and regular rhythm.  Exam reveals no gallop and no friction rub.   No murmur heard. Pulmonary/Chest: Effort normal and breath sounds normal. No respiratory distress. She has no wheezes. She has no rales.  Abdominal:  Soft. Bowel sounds are normal. She exhibits no distension. There is no tenderness. There is no rebound.  Musculoskeletal: She exhibits no deformity.  Limited active and passive ROM. Abduction at 90 degrees. Flexion at 90 degrees.   Neurological: She is alert and oriented to person, place, and time. No cranial nerve deficit.  Skin: Skin is warm and dry.  Psychiatric: She has a normal mood and affect. Her behavior is normal.   Lipid Panel     Component Value Date/Time   CHOL 255 (H) 01/25/2016 1015   TRIG 71 01/25/2016 1015   HDL 75 01/25/2016 1015   CHOLHDL 3.4 01/25/2016 1015   VLDL 14 01/25/2016 1015   LDLCALC 166 (H) 01/25/2016 1015      Assessment & Plan:   Jillian Stewart was seen today for annual exam and right shoulder pain.  Diagnoses and all orders for this visit:  1. Chronic right shoulder pain   2. Adhesive capsulitis of right shoulder -Refilled Naproxen-Esomeprazole 375-20 MG TBEC; Take 1 tablet twice a day. Advised patient to stop taking Aleve or another NSAID when taking this medication.  -Ambulatory referral to Orthopedic Surgery due to limited ROM of right shoulder. Patient has had multiple steroid injections in the right shoulder with minimal relief.   3. Mixed hyperlipidemia -Start colesevelam (WELCHOL) 625 MG tablet; Take 3 tablets (1,875 mg total) by mouth 2 (two) times daily with a meal for hyperlipidemia due to patient not able to tolerate statins.   4. Lung nodule seen on imaging        - Will request records from patient's previous pulmonologist in [place].         - Do not advise getting another CT since patient had three consecutive CT scans for three years, last one being five years ago unless patient is having symptoms of shortness of breath or chronic cough.   4. episode of recurrent major depressive disorder (HCC) - Refilled DULoxetine (CYMBALTA) 60 MG capsule; Take 1 capsule (60 mg total) by mouth daily.  5. Essential hypertension, benign -Refilled  amLODipine (NORVASC) 5 MG tablet; Take 1 tablet (5 mg total) by mouth daily to control hypertension. Blood pressure is well controlled at 128/75 today.    -Follow-up in 3-6 months to re-check cholesterol since patient is starting Welchol for hyperlipidemia.

## 2016-02-20 ENCOUNTER — Encounter: Payer: Self-pay | Admitting: Physician Assistant

## 2016-02-26 ENCOUNTER — Other Ambulatory Visit: Payer: Self-pay

## 2016-02-26 ENCOUNTER — Encounter: Payer: Self-pay | Admitting: Obstetrics & Gynecology

## 2016-02-26 ENCOUNTER — Ambulatory Visit (INDEPENDENT_AMBULATORY_CARE_PROVIDER_SITE_OTHER): Payer: 59 | Admitting: Obstetrics & Gynecology

## 2016-02-26 VITALS — BP 127/87 | HR 82 | Ht 65.0 in | Wt 133.0 lb

## 2016-02-26 DIAGNOSIS — N9089 Other specified noninflammatory disorders of vulva and perineum: Secondary | ICD-10-CM

## 2016-02-26 DIAGNOSIS — N904 Leukoplakia of vulva: Secondary | ICD-10-CM | POA: Diagnosis not present

## 2016-02-26 MED ORDER — ESTRADIOL 10 MCG VA TABS: ORAL_TABLET | VAGINAL | 12 refills | Status: DC

## 2016-02-26 NOTE — Progress Notes (Signed)
   Subjective:    Patient ID: Jillian Stewart, female    DOB: 12/08/1955, 60 y.o.   MRN: [id number]  HPI  60 yo MW lady here for a vulvar biopsy after I saw a dark lesion at her annual visit last month.  Review of Systems     Objective:   Physical Exam [id number] Breathing, conversing, and ambulating normally In addition to the brown area, there was also now white changes at the introitus. I prepped both areas  and used 1% lidocaine to numb the area. I then removed the entire brown lesion and sampled the edge of the white lesion.  Hemostasis was obtained with silver nitrate. She tolerated the procedure well.        Assessment & Plan:  Vulvar lesions- await pathology

## 2016-03-28 MED FILL — AMLODIPINE BESYLATE 5 MG TA: 5 | 90 days supply | Qty: 90 | Fill #4

## 2016-04-29 MED FILL — DULoxetine HCL 60 MG CPEP: 60 | 90 days supply | Qty: 90 | Fill #0

## 2016-06-26 MED FILL — AMLODIPINE BESYLATE 5 MG TA: 5 | 90 days supply | Qty: 90 | Fill #0

## 2016-07-14 DIAGNOSIS — M25511 Pain in right shoulder: Secondary | ICD-10-CM | POA: Diagnosis not present

## 2016-07-14 DIAGNOSIS — G8929 Other chronic pain: Secondary | ICD-10-CM | POA: Diagnosis not present

## 2016-07-14 DIAGNOSIS — M19011 Primary osteoarthritis, right shoulder: Secondary | ICD-10-CM | POA: Diagnosis not present

## 2016-08-05 MED FILL — DULoxetine HCL 60 MG CPEP: 60 | 90 days supply | Qty: 90 | Fill #1

## 2016-09-17 MED FILL — traMADol HCL 50 MG TABS: 50 | 30 days supply | Qty: 30 | Fill #0

## 2016-10-10 MED FILL — AMLODIPINE BESYLATE 5 MG TA: 5 | 90 days supply | Qty: 90 | Fill #1

## 2016-10-14 DIAGNOSIS — L814 Other melanin hyperpigmentation: Secondary | ICD-10-CM | POA: Diagnosis not present

## 2016-10-14 DIAGNOSIS — D485 Neoplasm of uncertain behavior of skin: Secondary | ICD-10-CM | POA: Diagnosis not present

## 2016-10-14 DIAGNOSIS — L821 Other seborrheic keratosis: Secondary | ICD-10-CM | POA: Diagnosis not present

## 2016-10-14 DIAGNOSIS — L820 Inflamed seborrheic keratosis: Secondary | ICD-10-CM | POA: Diagnosis not present

## 2016-11-03 DIAGNOSIS — M19011 Primary osteoarthritis, right shoulder: Secondary | ICD-10-CM | POA: Diagnosis not present

## 2016-11-03 DIAGNOSIS — M19012 Primary osteoarthritis, left shoulder: Secondary | ICD-10-CM | POA: Diagnosis not present

## 2016-11-04 MED FILL — DULoxetine HCL 60 MG CPEP: 60 | 90 days supply | Qty: 90 | Fill #2

## 2016-12-05 MED FILL — traMADol HCL 50 MG TABS: 50 | 30 days supply | Qty: 30 | Fill #0

## 2016-12-18 ENCOUNTER — Other Ambulatory Visit: Payer: Self-pay | Admitting: *Deleted

## 2016-12-18 DIAGNOSIS — Z1231 Encounter for screening mammogram for malignant neoplasm of breast: Secondary | ICD-10-CM

## 2016-12-25 ENCOUNTER — Ambulatory Visit (INDEPENDENT_AMBULATORY_CARE_PROVIDER_SITE_OTHER): Payer: 59 | Admitting: Osteopathic Medicine

## 2016-12-25 ENCOUNTER — Encounter: Payer: Self-pay | Admitting: Osteopathic Medicine

## 2016-12-25 VITALS — BP 134/85 | HR 67

## 2016-12-25 DIAGNOSIS — Z23 Encounter for immunization: Secondary | ICD-10-CM

## 2016-12-25 DIAGNOSIS — N3090 Cystitis, unspecified without hematuria: Secondary | ICD-10-CM

## 2016-12-25 DIAGNOSIS — R300 Dysuria: Secondary | ICD-10-CM | POA: Diagnosis not present

## 2016-12-25 LAB — POCT URINALYSIS DIPSTICK
Bilirubin, UA: NEGATIVE
Glucose, UA: NEGATIVE
Ketones, UA: NEGATIVE
NITRITE UA: NEGATIVE
PH UA: 7 (ref 5.0–8.0)
PROTEIN UA: NEGATIVE
Spec Grav, UA: <=1.005 — AB (ref 1.010–1.025)
UROBILINOGEN UA: 0.2 U/dL

## 2016-12-25 MED ORDER — SULFAMETHOXAZOLE-TRIMETHOPRIM 800-160 MG PO TABS: 1.0000 | ORAL_TABLET | Freq: Two times a day (BID) | ORAL | 0 refills | Status: DC

## 2016-12-25 MED FILL — SULFAMETHOXAZOLE/TMP DS TAB: 800-160 | 3 days supply | Qty: 6 | Fill #0

## 2016-12-25 NOTE — Progress Notes (Signed)
HPI: Jillian Stewart is a 61 y.o. female  who presents to [place] today, 12/25/16,  for chief complaint of:  Chief Complaint  Patient presents with  . Other    bladder pain    Burning with urination since this morning. No fever/chills. (+)vaginal atrophy has been untreated some time now d/t cost of topical estrogen    Past medical history, surgical history, social history and family history reviewed.  Patient Active Problem List   Diagnosis Date Noted  . Lung nodule seen on imaging study 02/19/2016  . GERD (gastroesophageal reflux disease) 04/11/2015  . Acute sinus infection 02/15/2015  . Right shoulder pain 11/04/2014  . Degenerative joint disease of left hip 10/27/2014  . History of total left hip arthroplasty 10/27/2014  . Generalized anxiety disorder 03/28/2014  . Depression 03/28/2014  . Degenerative joint disease involving multiple joints 03/28/2014  . Raynaud disease 03/28/2014  . Seasonal allergies 03/28/2014  . Essential hypertension, benign 03/28/2014  . Seborrheic dermatitis 03/28/2014  . Hyperlipidemia 03/28/2014    Current medication list and allergy/intolerance information reviewed.   Current Outpatient Prescriptions on File Prior to Visit  Medication Sig Dispense Refill  . amLODipine (NORVASC) 5 MG tablet Take 1 tablet (5 mg total) by mouth daily. 90 tablet 3  . cholecalciferol (VITAMIN D) 1000 units tablet Take 1,000 Units by mouth daily.    . colesevelam (WELCHOL) 625 MG tablet Take 3 tablets (1,875 mg total) by mouth 2 (two) times daily with a meal. 180 tablet 5  . DULoxetine (CYMBALTA) 60 MG capsule Take 1 capsule (60 mg total) by mouth daily. 90 capsule 3  . Estradiol 10 MCG TABS vaginal tablet 1 tablet qhs for 2 weeks, then 3 times per week 30 tablet 12  . fluticasone (FLONASE) 50 MCG/ACT nasal spray One spray in each nostril twice a day, use left hand for right nostril, and right hand for left nostril. 48 g 3  .  ipratropium (ATROVENT) 0.06 % nasal spray Place 2 sprays into both nostrils 4 (four) times daily. 15 mL 1  . Multiple Vitamins-Minerals (MULTIVITAMINS) CHEW Chew 1 tablet by mouth daily.    . Naproxen-Esomeprazole 375-20 MG TBEC Take 1 tablet twice a day. 60 tablet 11  . pantoprazole (PROTONIX) 40 MG tablet Take 1 tablet (40 mg total) by mouth daily. 30 tablet 2   No current facility-administered medications on file prior to visit.    Allergies  Allergen Reactions  . Statins Other (See Comments)    Severe muscle cramps.  Pravastatin, Simvastatin, Atorvastatin, & Livalo  . Zetia [Ezetimibe]     Muscle cramps      Review of Systems:  Constitutional: No fever/chills  Respiratory:  No  shortness of breath.   Gastrointestinal: No  abdominal pain  Skin: No  Rash   Exam:  BP 134/85   Pulse 67   Constitutional: VS see above. General Appearance: alert, well-developed, well-nourished, NAD  Respiratory: Normal respiratory effort.   Musculoskeletal: Gait normal. Symmetric and independent movement of all extremities  Neurological: Normal balance/coordination. No tremor.  Psychiatric: Normal judgment/insight. Normal mood and affect. Oriented x3.    Recent Results (from the past 2160 hour(s))  POCT Urinalysis Dipstick     Status: Abnormal   Collection Time: 12/25/16  1:09 PM  Result Value Ref Range   Color, UA yellow    Clarity, UA clear    Glucose, UA negative    Bilirubin, UA negative    Ketones, UA negative  Spec Grav, UA <=1.005 (A) 1.010 - 1.025   Blood, UA moderate    pH, UA 7.0 5.0 - 8.0   Protein, UA negative    Urobilinogen, UA 0.2 0.2 or 1.0 E.U./dL   Nitrite, UA negative    Leukocytes, UA Small (1+) (A) Negative     ASSESSMENT/PLAN:   Cystitis - Plan: POCT Urinalysis Dipstick, Urine Culture, Urine Microscopic  Need for immunization against influenza - Plan: Flu Vaccine QUAD 36+ mos IM     Follow-up plan: Return if symptoms worsen or fail to  improve.  Visit summary with medication list and pertinent instructions was printed for patient to review, alert [place] if any changes needed. All questions at time of visit were answered - patient instructed to contact office with any additional concerns. ER/RTC precautions were reviewed with the patient and understanding verbalized.   Note: Total time spent 15 minutes, greater than 50% of the visit was spent face-to-face counseling and coordinating care for the following: The primary encounter diagnosis was Cystitis. A diagnosis of Need for immunization against influenza was also pertinent to this visit.Marland Kitchen

## 2016-12-26 ENCOUNTER — Ambulatory Visit (INDEPENDENT_AMBULATORY_CARE_PROVIDER_SITE_OTHER): Payer: 59 | Admitting: Family Medicine

## 2016-12-26 ENCOUNTER — Encounter: Payer: Self-pay | Admitting: Family Medicine

## 2016-12-26 VITALS — BP 137/82 | HR 81 | Ht 65.0 in | Wt 133.0 lb

## 2016-12-26 DIAGNOSIS — R21 Rash and other nonspecific skin eruption: Secondary | ICD-10-CM | POA: Diagnosis not present

## 2016-12-26 LAB — URINALYSIS, MICROSCOPIC ONLY
HYALINE CAST: NONE SEEN /LPF
SQUAMOUS EPITHELIAL / LPF: NONE SEEN /HPF (ref <=5)

## 2016-12-26 MED ORDER — CLOTRIMAZOLE-BETAMETHASONE 1-0.05 % EX CREA: 1.0000 "application " | TOPICAL_CREAM | Freq: Two times a day (BID) | CUTANEOUS | 0 refills | Status: DC

## 2016-12-26 MED FILL — CLOTRIMAZOLE-BETAMETHASONE: 1-0.05 | 15 days supply | Qty: 30 | Fill #0

## 2016-12-26 NOTE — Patient Instructions (Signed)
Avoid scratching

## 2016-12-26 NOTE — Progress Notes (Signed)
   Subjective:    Patient ID: Jillian Stewart, female    DOB: 1955-04-26, 61 y.o.   MRN: [id number]  HPI She has some bumps in between her breasts. Initially had a few bumps when she went to her dermatologist but today she felt it looked more red and irritated. It had seemed more irritated over the last 5-6 days. .  She has been "scratching" at them some, but not enough where it should be this irritate.  The are itchy and using bacitracin and hydrocortisone x 1 days.   She denies any new soaps, lotions, detergents etc. She has been using some type of acne treatment she can limit the name of it but it starts with a T.   Review of Systems     Objective:   Physical Exam  Constitutional: She is oriented to person, place, and time. She appears well-developed and well-nourished.  HENT:  Head: Normocephalic and atraumatic.  Eyes: Conjunctivae and EOM are normal.  Cardiovascular: Normal rate.   Pulmonary/Chest: Effort normal.  Neurological: She is alert and oriented to person, place, and time.  Skin: Skin is dry. Rash noted. No pallor.  She has an erythematous maculopapular rash just between the breast area. No open wounds or vesicles. There is a couple of excoriations and some peeling skin around the edge. The rash almost looks shiny but she has bacitracin on it so it's difficult to tell  Psychiatric: She has a normal mood and affect. Her behavior is normal.  Vitals reviewed.       Assessment & Plan:  Rash-KOH performed. Will call with results once available. Recommend a trial of Lotrisone over the weekend. She'll call if it gets worse or not improving. Recommend avoiding scratching or irritating the area. Just cleaning with warm soapy water. Avoid any type of acne wash.

## 2016-12-27 LAB — URINE CULTURE
MICRO NUMBER: 81165842
SPECIMEN QUALITY:: ADEQUATE

## 2016-12-29 LAB — FUNGAL STAIN
FUNGAL SMEAR: NONE SEEN
MICRO NUMBER:: 81173461
SPECIMEN QUALITY:: ADEQUATE

## 2017-01-01 MED FILL — traMADol HCL 50 MG TABS: 50 | 30 days supply | Qty: 30 | Fill #0

## 2017-01-12 MED FILL — AMLODIPINE BESYLATE 5 MG TA: 5 | 90 days supply | Qty: 90 | Fill #2

## 2017-01-28 ENCOUNTER — Ambulatory Visit: Payer: 59

## 2017-01-28 ENCOUNTER — Ambulatory Visit: Payer: 59 | Admitting: Obstetrics & Gynecology

## 2017-02-02 MED FILL — traMADol HCL 50 MG TABS: 50 | 30 days supply | Qty: 30 | Fill #0

## 2017-02-03 MED FILL — DULoxetine HCL 60 MG CPEP: 60 | 90 days supply | Qty: 90 | Fill #3

## 2017-02-09 ENCOUNTER — Encounter: Payer: Self-pay | Admitting: Obstetrics & Gynecology

## 2017-02-09 ENCOUNTER — Ambulatory Visit (INDEPENDENT_AMBULATORY_CARE_PROVIDER_SITE_OTHER): Payer: 59 | Admitting: Obstetrics & Gynecology

## 2017-02-09 VITALS — BP 122/85 | HR 78 | Ht 63.0 in | Wt 128.0 lb

## 2017-02-09 DIAGNOSIS — N909 Noninflammatory disorder of vulva and perineum, unspecified: Secondary | ICD-10-CM | POA: Diagnosis not present

## 2017-02-09 DIAGNOSIS — Z124 Encounter for screening for malignant neoplasm of cervix: Secondary | ICD-10-CM

## 2017-02-09 DIAGNOSIS — Z1151 Encounter for screening for human papillomavirus (HPV): Secondary | ICD-10-CM

## 2017-02-09 DIAGNOSIS — Z01419 Encounter for gynecological examination (general) (routine) without abnormal findings: Secondary | ICD-10-CM | POA: Diagnosis not present

## 2017-02-09 DIAGNOSIS — N9089 Other specified noninflammatory disorders of vulva and perineum: Secondary | ICD-10-CM

## 2017-02-09 NOTE — Progress Notes (Signed)
Subjective:    Jillian Stewart is a 61 y.o. MW P3 female who presents for an annual exam. She reports that the vagifem was too expensive so she is still suffereing with vaginal dryness. The patient is sexually active. GYN screening history: last pap: was normal. The patient wears seatbelts: yes. The patient participates in regular exercise: yes. Has the patient ever been transfused or tattooed?: no. The patient reports that there is not domestic violence in her life.   Menstrual History: OB History    Gravida Para Term Preterm AB Living   3 3 3     3    SAB TAB Ectopic Multiple Live Births           3      Menarche age: 53 No LMP recorded. Patient is postmenopausal.    The following portions of the patient's history were reviewed and updated as appropriate: allergies, current medications, past family history, past medical history, past social history, past surgical history and problem list.  Review of Systems Pertinent items are noted in HPI.   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 Walks 2 miles daily She has lost 14# by dieting and exercising   Objective:    BP 122/85   Pulse 78   Ht 5\' 3"  (1.6 m)   Wt 128 lb (58.1 kg)   BMI 22.67 kg/m   General Appearance:    Alert, cooperative, no distress, appears stated age  Head:    Normocephalic, without obvious abnormality, atraumatic  Eyes:    PERRL, conjunctiva/corneas clear, EOM's intact, fundi    benign, both eyes  Ears:    Normal TM's and external ear canals, both ears  Nose:   Nares normal, septum midline, mucosa normal, no drainage    or sinus tenderness  Throat:   Lips, mucosa, and tongue normal; teeth and gums normal  Neck:   Supple, symmetrical, trachea midline, no adenopathy;    thyroid:  no enlargement/tenderness/nodules; no carotid   bruit or JVD  Back:     Symmetric, no curvature, ROM normal, no CVA tenderness  Lungs:     Clear to auscultation  bilaterally, respirations unlabored  Chest Wall:    No tenderness or deformity   Heart:    Regular rate and rhythm, S1 and S2 normal, no murmur, rub   or gallop  Breast Exam:    No tenderness, masses, or nipple abnormality  Abdomen:     Soft, non-tender, bowel sounds active all four quadrants,    no masses, no organomegaly  Genitalia:    Severe atrophy, small white area (new) at the vaginal opening, more on right. I biopsied this area after using betadine and lidocaine. Normal bimanual exam     Extremities:   Extremities normal, atraumatic, no cyanosis or edema  Pulses:   2+ and symmetric all extremities  Skin:   Skin color, texture, turgor normal, no rashes or lesions  Lymph nodes:   Cervical, supraclavicular, and axillary nodes normal  Neurologic:   CNII-XII intact, normal strength, sensation and reflexes    throughout  .    Assessment:    Healthy female exam.   Vulvar lesion   Plan:     Thin prep Pap smear. with cotesting Mammogram scheduled this Thursday Use compounded vaginal estrogen

## 2017-02-11 ENCOUNTER — Other Ambulatory Visit (INDEPENDENT_AMBULATORY_CARE_PROVIDER_SITE_OTHER): Payer: 59

## 2017-02-11 DIAGNOSIS — Z01419 Encounter for gynecological examination (general) (routine) without abnormal findings: Secondary | ICD-10-CM | POA: Diagnosis not present

## 2017-02-11 DIAGNOSIS — N9089 Other specified noninflammatory disorders of vulva and perineum: Secondary | ICD-10-CM

## 2017-02-12 ENCOUNTER — Ambulatory Visit (INDEPENDENT_AMBULATORY_CARE_PROVIDER_SITE_OTHER): Payer: 59

## 2017-02-12 ENCOUNTER — Telehealth: Payer: Self-pay | Admitting: *Deleted

## 2017-02-12 DIAGNOSIS — Z1231 Encounter for screening mammogram for malignant neoplasm of breast: Secondary | ICD-10-CM | POA: Diagnosis not present

## 2017-02-12 LAB — COMPREHENSIVE METABOLIC PANEL
AG RATIO: 1.7 (calc) (ref 1.0–2.5)
ALBUMIN MSPROF: 4.3 g/dL (ref 3.6–5.1)
ALT: 18 U/L (ref 6–29)
AST: 23 U/L (ref 10–35)
Alkaline phosphatase (APISO): 60 U/L (ref 33–130)
BUN: 19 mg/dL (ref 7–25)
CHLORIDE: 104 mmol/L (ref 98–110)
CO2: 28 mmol/L (ref 20–32)
CREATININE: 0.82 mg/dL (ref 0.50–0.99)
Calcium: 9.3 mg/dL (ref 8.6–10.4)
GLOBULIN: 2.6 g/dL (ref 1.9–3.7)
GLUCOSE: 85 mg/dL (ref 65–99)
POTASSIUM: 4.1 mmol/L (ref 3.5–5.3)
SODIUM: 139 mmol/L (ref 135–146)
TOTAL PROTEIN: 6.9 g/dL (ref 6.1–8.1)
Total Bilirubin: 0.5 mg/dL (ref 0.2–1.2)

## 2017-02-12 LAB — CBC
HEMATOCRIT: 40.3 % (ref 35.0–45.0)
HEMOGLOBIN: 13.8 g/dL (ref 11.7–15.5)
MCH: 30.8 pg (ref 27.0–33.0)
MCHC: 34.2 g/dL (ref 32.0–36.0)
MCV: 90 fL (ref 80.0–100.0)
MPV: 9.9 fL (ref 7.5–12.5)
Platelets: 266 10*3/uL (ref 140–400)
RBC: 4.48 10*6/uL (ref 3.80–5.10)
RDW: 12.5 % (ref 11.0–15.0)
WBC: 4.3 10*3/uL (ref 3.8–10.8)

## 2017-02-12 LAB — CYTOLOGY - PAP
DIAGNOSIS: NEGATIVE
HPV (WINDOPATH): NOT DETECTED

## 2017-02-12 LAB — LIPID PANEL
CHOL/HDL RATIO: 3.3 (calc) (ref <5.0)
Cholesterol: 241 mg/dL — ABNORMAL HIGH (ref <200)
HDL: 72 mg/dL (ref >50)
LDL Cholesterol (Calc): 149 mg/dL (calc) — ABNORMAL HIGH
NON-HDL CHOLESTEROL (CALC): 169 mg/dL — AB (ref <130)
Triglycerides: 90 mg/dL (ref <150)

## 2017-02-12 LAB — TSH: TSH: 2.72 mIU/L (ref 0.40–4.50)

## 2017-02-12 NOTE — Telephone Encounter (Signed)
A copy of abnormal Cholesterol levels mailed to pt's home address.  Instructed pt to f/u with her PCP to manage her hyperlipidemia

## 2017-02-17 ENCOUNTER — Telehealth: Payer: Self-pay | Admitting: *Deleted

## 2017-02-17 NOTE — Telephone Encounter (Signed)
Pt notified of benign pathology.  She will f/u prn

## 2017-02-18 ENCOUNTER — Encounter: Payer: Self-pay | Admitting: Physician Assistant

## 2017-02-18 ENCOUNTER — Ambulatory Visit (INDEPENDENT_AMBULATORY_CARE_PROVIDER_SITE_OTHER): Payer: 59 | Admitting: Physician Assistant

## 2017-02-18 VITALS — BP 142/82 | HR 71 | Ht 63.0 in | Wt 127.0 lb

## 2017-02-18 DIAGNOSIS — I7300 Raynaud's syndrome without gangrene: Secondary | ICD-10-CM

## 2017-02-18 DIAGNOSIS — E782 Mixed hyperlipidemia: Secondary | ICD-10-CM

## 2017-02-18 DIAGNOSIS — Z823 Family history of stroke: Secondary | ICD-10-CM

## 2017-02-18 DIAGNOSIS — F330 Major depressive disorder, recurrent, mild: Secondary | ICD-10-CM

## 2017-02-18 DIAGNOSIS — K5900 Constipation, unspecified: Secondary | ICD-10-CM

## 2017-02-18 DIAGNOSIS — I10 Essential (primary) hypertension: Secondary | ICD-10-CM

## 2017-02-18 DIAGNOSIS — Z0000 Encounter for general adult medical examination without abnormal findings: Secondary | ICD-10-CM | POA: Diagnosis not present

## 2017-02-18 MED ORDER — LUBIPROSTONE 8 MCG PO CAPS: 8.0000 ug | ORAL_CAPSULE | Freq: Two times a day (BID) | ORAL | 2 refills | Status: DC

## 2017-02-18 MED ORDER — DULOXETINE HCL 60 MG PO CPEP: 60.0000 mg | ORAL_CAPSULE | Freq: Every day | ORAL | 3 refills | Status: DC

## 2017-02-18 MED ORDER — AMLODIPINE BESYLATE 5 MG PO TABS: 5.0000 mg | ORAL_TABLET | Freq: Every day | ORAL | 3 refills | Status: DC

## 2017-02-18 NOTE — Patient Instructions (Signed)
START ASA 81mg .  Make sure taking vitamin D and calcium 1300mg .  Keeping You Healthy  Get These Tests  Blood Pressure- Have your blood pressure checked by your healthcare provider at least once a year.  Normal blood pressure is 120/80.  Weight- Have your body mass index (BMI) calculated to screen for obesity.  BMI is a measure of body fat based on height and weight.  You can calculate your own BMI at [employer].it  Cholesterol- Have your cholesterol checked every year.  Diabetes- Have your blood sugar checked every year if you have high blood pressure, high cholesterol, a family history of diabetes or if you are overweight.  Pap Test - Have a pap test every 1 to 5 years if you have been sexually active.  If you are older than 65 and recent pap tests have been normal you may not need additional pap tests.  In addition, if you have had a hysterectomy  for benign disease additional pap tests are not necessary.  Mammogram-Yearly mammograms are essential for early detection of breast cancer  Screening for Colon Cancer- Colonoscopy starting at age 11. Screening may begin sooner depending on your family history and other health conditions.  Follow up colonoscopy as directed by your Gastroenterologist.  Screening for Osteoporosis- Screening begins at age 63 with bone density scanning, sooner if you are at higher risk for developing Osteoporosis.  Get these medicines  Calcium with Vitamin D- Your body requires 1200-1500 mg of Calcium a day and [phone] IU of Vitamin D a day.  You can only absorb 500 mg of Calcium at a time therefore Calcium must be taken in 2 or 3 separate doses throughout the day.  Hormones- Hormone therapy has been associated with increased risk for certain cancers and heart disease.  Talk to your healthcare provider about if you need relief from menopausal symptoms.  Aspirin- Ask your healthcare provider about taking Aspirin to prevent Heart Disease and  Stroke.  Get these Immuniztions  Flu shot- Every fall  Pneumonia shot- Once after the age of 21; if you are younger ask your healthcare provider if you need a pneumonia shot.  Tetanus- Every ten years.  Zostavax- Once after the age of 63 to prevent shingles.  Take these steps  Don't smoke- Your healthcare provider can help you quit. For tips on how to quit, ask your healthcare provider or go to www.smokefree.gov or call 1-800 QUIT-NOW.  Be physically active- Exercise 5 days a week for a minimum of 30 minutes.  If you are not already physically active, start slow and gradually work up to 30 minutes of moderate physical activity.  Try walking, dancing, bike riding, swimming, etc.  Eat a healthy diet- Eat a variety of healthy foods such as fruits, vegetables, whole grains, low fat milk, low fat cheeses, yogurt, lean meats, chicken, fish, eggs, dried beans, tofu, etc.  For more information go to www.thenutritionsource.org  Dental visit- Brush and floss teeth twice daily; visit your dentist twice a year.  Eye exam- Visit your Optometrist or Ophthalmologist yearly.  Drink alcohol in moderation- Limit alcohol intake to one drink or less a day.  Never drink and drive.  Depression- Your emotional health is as important as your physical health.  If you're feeling down or losing interest in things you normally enjoy, please talk to your healthcare provider.  Seat Belts- can save your life; always wear one  Smoke/Carbon Monoxide detectors- These detectors need to be installed on the appropriate level of  your home.  Replace batteries at least once a year.  Violence- If anyone is threatening or hurting you, please tell your healthcare provider.  Living Will/ Health care power of attorney- Discuss with your healthcare provider and family.  Evolocumab injection What is this medicine? EVOLOCUMAB (e voe LOK ue mab) is known as a PCSK9 inhibitor. It is used to lower the level of cholesterol in the  blood. It may be used alone or in combination with other cholesterol-lowering drugs. This drug may also be used to reduce the risk of heart attack, stroke, and certain types of heart surgery in patients with heart disease. This medicine may be used for other purposes; ask your health care provider or pharmacist if you have questions. COMMON BRAND NAME(S): REPATHA What should I tell my health care provider before I take this medicine? They need to know if you have any of these conditions: -an unusual or allergic reaction to evolocumab, other medicines, foods, dyes, or preservatives -pregnant or trying to get pregnant -breast-feeding How should I use this medicine? This medicine is for injection under the skin. You will be taught how to prepare and give this medicine. Use exactly as directed. Take your medicine at regular intervals. Do not take your medicine more often than directed. It is important that you put your used needles and syringes in a special sharps container. Do not put them in a trash can. If you do not have a sharps container, call your pharmacist or health care provider to get one. Talk to your pediatrician regarding the use of this medicine in children. While this drug may be prescribed for children as young as 13 years for selected conditions, precautions do apply. Overdosage: If you think you have taken too much of this medicine contact a poison control center or emergency room at once. NOTE: This medicine is only for you. Do not share this medicine with others. What if I miss a dose? If you miss a dose, take it as soon as you can if there are more than 7 days until the next scheduled dose, or skip the missed dose and take the next dose according to your original schedule. Do not take double or extra doses. What may interact with this medicine? Interactions are not expected. This list may not describe all possible interactions. Give your health care provider a list of all the  medicines, herbs, non-prescription drugs, or dietary supplements you use. Also tell them if you smoke, drink alcohol, or use illegal drugs. Some items may interact with your medicine. What should I watch for while using this medicine? You may need blood work while you are taking this medicine. What side effects may I notice from receiving this medicine? Side effects that you should report to your doctor or health care professional as soon as possible: -allergic reactions like skin rash, itching or hives, swelling of the face, lips, or tongue -signs and symptoms of infection like fever or chills; cough; sore throat; pain or trouble passing urine Side effects that usually do not require medical attention (report to your doctor or health care professional if they continue or are bothersome): -diarrhea -nausea -muscle pain -pain, redness, or irritation at site where injected This list may not describe all possible side effects. Call your doctor for medical advice about side effects. You may report side effects to FDA at 1-800-FDA-1088. Where should I keep my medicine? Keep out of the reach of children. You will be instructed on how to store this  medicine. Throw away any unused medicine after the expiration date on the label. NOTE: This sheet is a summary. It may not cover all possible information. If you have questions about this medicine, talk to your doctor, pharmacist, or health care provider.  2018 Elsevier/Gold Standard (2016-02-11 13:21:53)

## 2017-02-18 NOTE — Progress Notes (Addendum)
Subjective:    Patient ID: Jillian Stewart, female    DOB: Jan 21, 1956, 61 y.o.   MRN: [id number]  HPI Patient is a 61 y/o female who presents for physical exam.  HTN - Patient is doing well on Norvasc once daily at night. BP elevated today 153/78 mmHg initially, but then 142/82 mmHg at the end of the visit. Denies chest pain, palpitations, SOB.   Mixed hyperlipidemia - Patient has tried multiple statins and lipid lowering medications (Zetia and welchol) but was unable to tolerated them due to severe myalgias. She has lost 10 lbs. Her lipids on 02/11/17 were elevated with LDL 149, total cholesterol 241, HDL 72, and triglycerides 90. She has a family history of stroke on her fathers side.  Constipation - Patient reports constipation that requires Miralax every couple days, however after using it for a few days she has very loose stools. She also reports trying probiotics without relief. Patient had colonoscopies in the past which revealed 2 polyps but no functional cause of her constipation, per patient report. She is due for repeat colonoscopy in the spring.  Lump on back of tongue - Patient reports lump on the back of her tongue that appeared 2-3 mos ago. She reports that she sometimes has swollen taste buds on the sides of her tongues, particularly when eating sour or vinegary food. She denies pain, choking or difficulty swallowing, however reports that her throat sometimes "feels tight". She reports dry mouth from her Cymbalta.  .. Active Ambulatory Problems    Diagnosis Date Noted  . Generalized anxiety disorder 03/28/2014  . Depression 03/28/2014  . Degenerative joint disease involving multiple joints 03/28/2014  . Raynaud disease 03/28/2014  . Seasonal allergies 03/28/2014  . Essential hypertension, benign 03/28/2014  . Seborrheic dermatitis 03/28/2014  . Hyperlipidemia 03/28/2014  . Degenerative joint disease of left hip 10/27/2014  . History of total left hip arthroplasty 10/27/2014   . Right shoulder pain 11/04/2014  . Acute sinus infection 02/15/2015  . GERD (gastroesophageal reflux disease) 04/11/2015  . Lung nodule seen on imaging study 02/19/2016   Resolved Ambulatory Problems    Diagnosis Date Noted  . No Resolved Ambulatory Problems   Past Medical History:  Diagnosis Date  . Anxiety   . Arthritis   . Chronic constipation   . Colon polyps   . Depression   . GERD (gastroesophageal reflux disease)   . Hyperlipidemia   . Hypertension    .Marland Kitchen Family History  Problem Relation Age of Onset  . Cancer Mother        cholangiocarcinoma  . Stroke Father   . Diabetes Maternal Grandmother    .Marland Kitchen Social History   Socioeconomic History  . Marital status: Married    Spouse name: Not on file  . Number of children: 3   . Years of education: Not on file  . Highest education level: Not on file  Social Needs  . Financial resource strain: Not on file  . Food insecurity - worry: Not on file  . Food insecurity - inability: Not on file  . Transportation needs - medical: Not on file  . Transportation needs - non-medical: Not on file  Occupational History  . Occupation: retired  Tobacco Use  . Smoking status: Former Smoker    Types: Cigarettes    Last attempt to quit: 03/13/2003    Years since quitting: 13.9  . Smokeless tobacco: Never Used  Substance and Sexual Activity  . Alcohol use: Yes    Alcohol/week:  0.6 - 1.2 oz    Types: 1 - 2 Cans of beer per week  . Drug use: No  . Sexual activity: Yes    Birth control/protection: Post-menopausal  Other Topics Concern  . Not on file  Social History Narrative   Walks for exercise daily. 3 daughters.       Review of Systems See HPI, all other systems reviewed are negative.    Objective:   Physical Exam  Constitutional: She is oriented to person, place, and time. She appears well-developed and well-nourished.  HENT:  Head: Normocephalic and atraumatic.  Neck: No thyromegaly present.  Cardiovascular: Normal  rate, regular rhythm and normal heart sounds.  No carotid bruits  Abdominal: Soft. Bowel sounds are normal. There is no tenderness. There is no rebound and no guarding.  Lymphadenopathy:    She has no cervical adenopathy.  Neurological: She is alert and oriented to person, place, and time. She has normal reflexes.  Skin: Skin is warm and dry.  Psychiatric: She has a normal mood and affect. Her behavior is normal. Thought content normal.  Vitals reviewed.     Assessment & Plan:  .Marland KitchenMarland KitchenKeyerra was seen today for annual exam.  Diagnoses and all orders for this visit:  Routine physical examination  Mild episode of recurrent major depressive disorder ([place]) -     DULoxetine (CYMBALTA) 60 MG capsule; Take 1 capsule (60 mg total) by mouth daily.  Constipation, unspecified constipation type  Essential hypertension, benign  Raynaud's disease without gangrene  Mixed hyperlipidemia -     VAS US CAROTID; Future -     VAS US CAROTID  Family history of stroke -     VAS US CAROTID; Future -     VAS US CAROTID  Other orders -     amLODipine (NORVASC) 5 MG tablet; Take 1 tablet (5 mg total) by mouth daily. -     lubiprostone (AMITIZA) 8 MCG capsule; Take 1 capsule (8 mcg total) by mouth 2 (two) times daily with a meal.  .. Depression screen [facility] 2/9 02/18/2017 04/04/2015  Decreased Interest 0 0  Down, Depressed, Hopeless 0 0  PHQ - 2 Score 0 0     HTN - Patients BP was elevated today at 153/78 mmHg initially, but then 142/82 mmHg at the end of the visit. Advised patient to continue Norvasc as prescribed and monitor her BP at home. If she continues to have elevated BP above 140/90 mmHg we will increase her dose of Norvasc to 10mg  daily.  -follow up in 1 month.  Mixed hyperlipidemia - Patient has tried statins and multiple other lipid lowering medications in the past however is unable to tolerate them due to myalgias. Discussed Repatha injections to reduce cholesterol and provided patient  with information about the drug. Advised patient to closely monitor her BP at home and that maintaining a low BP below 140/90 mmHg will reduce her cardiovascular risk. Patient also has extensive family history of stroke on her fathers side so advised patient to take 81mg  Aspirin daily to decrease cardiovascular risk. Will also send referral for carotid US to assess for plaque build up due to her family history.  -with current blood pressure her CVD 10 year risk was 8.3 which medication management is recommended. If her BP is controlled her CVD 10 year risk is 6.3 which medication is not indicated. Discussed importance of BP control since she can not tolerate statins.    Lump on tongue - Likely enlarged taste buds.  Discussed with patient that if she starts to experience choking, difficulty swallowing, or pain then she should follow-up for reevaluation.  Constipation - Amitiza 8 mcg twice daily. Discussed with patient that it is a twice daily drug but she can take it once daily if her stools are too loose. Advised patient to monitor her symptoms and send a mychart message as an update on her symptoms.  Depression - Patient doing well on Cymbalta, continue as prescribed.  Health maintenance - Patient declined Shingrix vaccination today. Discussed risks and benefits of the vaccine and provided patient with information on the vaccination and Shingles. Encouraged patient to add calcium 1300 IU and vitamin D 800 IU supplements daily for bone health and osteoporosis prevention. Patients lab work from 02/11/17 showed normal thyroid, kidney and liver function. -continue to exercise 150 minutes a week.  -pap and mammogram up to date.  -pt reports she will get colonoscopy this spring.  -will wait on hep C until we get future labs

## 2017-02-27 ENCOUNTER — Telehealth: Payer: Self-pay | Admitting: *Deleted

## 2017-02-27 DIAGNOSIS — G8929 Other chronic pain: Secondary | ICD-10-CM

## 2017-02-27 DIAGNOSIS — M25511 Pain in right shoulder: Principal | ICD-10-CM

## 2017-02-27 NOTE — Telephone Encounter (Signed)
Referral placed.

## 2017-03-06 ENCOUNTER — Ambulatory Visit (HOSPITAL_COMMUNITY): Payer: 59

## 2017-03-06 ENCOUNTER — Telehealth: Payer: Self-pay | Admitting: Physician Assistant

## 2017-03-06 NOTE — Telephone Encounter (Signed)
Pt called and left voicemail stating that [facility] had sent a referral to Orthopedic Dr. Onnie Graham and stated that the referral needs to be signed/sent by an MD and can not be sent by a PA. Thanks

## 2017-03-06 NOTE — Telephone Encounter (Signed)
This is phone msg from patient that I needed referral for ortho.

## 2017-03-09 MED FILL — traMADol HCL 50 MG TABS: 50 | 30 days supply | Qty: 30 | Fill #0

## 2017-03-12 ENCOUNTER — Telehealth: Payer: Self-pay | Admitting: *Deleted

## 2017-03-12 DIAGNOSIS — M25511 Pain in right shoulder: Principal | ICD-10-CM

## 2017-03-12 DIAGNOSIS — G8929 Other chronic pain: Secondary | ICD-10-CM

## 2017-03-12 NOTE — Telephone Encounter (Signed)
New referral placed.

## 2017-03-13 ENCOUNTER — Other Ambulatory Visit: Payer: Self-pay | Admitting: Physician Assistant

## 2017-03-13 MED ORDER — LINACLOTIDE 145 MCG PO CAPS: 145.0000 ug | ORAL_CAPSULE | Freq: Every day | ORAL | 2 refills | Status: DC

## 2017-03-13 MED FILL — LINZESS 145 MCG CAPSULE: 145 | 30 days supply | Qty: 30 | Fill #0

## 2017-03-13 NOTE — Progress Notes (Signed)
Insurance requested linzess.

## 2017-03-16 NOTE — Pre-Procedure Instructions (Addendum)
Jillian Stewart  03/16/2017      [place] [address] Phone: [phone] Fax: [place], [place] [address] Phone: [phone] Fax: [phone]    Your procedure is scheduled on March 19, 2017.  Report to [facility] Admitting at [phone] AM.  Call this number if you have problems the morning of surgery:  [phone]   Remember:  Do not eat food or drink liquids after midnight.  Take these medicines the morning of surgery with A SIP OF WATER amlodipine (norvasc), duloxetine (cymbalta), linaclotide (linzess), tramadol (ultram).  Beginning now, STOP taking any Aspirin (unless otherwise instructed by your surgeon), Aleve, Naproxen, Ibuprofen, Motrin, Advil, Goody's, BC's, all herbal medications, fish oil, and all vitamins  Continue all other medications as instructed by your physician except follow the above medication instructions before surgery   Do not wear jewelry, make-up or nail polish.  Do not wear lotions, powders, or perfumes, or deodorant.  Do not shave 48 hours prior to surgery.  Men may shave face and neck.  Do not bring valuables to the hospital.  [facility] is not responsible for any belongings or valuables.  Contacts, dentures or bridgework may not be worn into surgery.  Leave your suitcase in the car.  After surgery it may be brought to your room.  For patients admitted to the hospital, discharge time will be determined by your treatment team.  Patients discharged the day of surgery will not be allowed to drive home.   Special instructions:   [facility]- Preparing For Surgery  Before surgery, you can play an important role. Because skin is not sterile, your skin needs to be as free of germs as possible. You can reduce the number of germs on  your skin by washing with CHG (chlorahexidine gluconate) Soap before surgery.  CHG is an antiseptic cleaner which kills germs and bonds with the skin to continue killing germs even after washing.  Please do not use if you have an allergy to CHG or antibacterial soaps. If your skin becomes reddened/irritated stop using the CHG.  Do not shave (including legs and underarms) for at least 48 hours prior to first CHG shower. It is OK to shave your face.  Please follow these instructions carefully.   1. Shower the NIGHT BEFORE SURGERY and the MORNING OF SURGERY with CHG.   2. If you chose to wash your hair, wash your hair first as usual with your normal shampoo.  3. After you shampoo, rinse your hair and body thoroughly to remove the shampoo.  4. Use CHG as you would any other liquid soap. You can apply CHG directly to the skin and wash gently with a scrungie or a clean washcloth.   5. Apply the CHG Soap to your body ONLY FROM THE NECK DOWN.  Do not use on open wounds or open sores. Avoid contact with your eyes, ears, mouth and genitals (private parts). Wash Face and genitals (private parts)  with your normal soap.  6. Wash thoroughly, paying special attention to the area where your surgery will be performed.  7. Thoroughly rinse your body with warm water from the neck down.  8. DO NOT shower/wash with your normal soap after using and rinsing off the CHG Soap.  9. Pat yourself  dry with a CLEAN TOWEL.  10. Wear CLEAN PAJAMAS to bed the night before surgery, wear comfortable clothes the morning of surgery  11. Place CLEAN SHEETS on your bed the night of your first shower and DO NOT SLEEP WITH PETS.   Day of Surgery: Do not apply any deodorants/lotions. Please wear clean clothes to the hospital/surgery center.    Please read over the following fact sheets that you were given. Pain Booklet, Coughing and Deep Breathing and Surgical Site Infection Prevention

## 2017-03-16 NOTE — Progress Notes (Addendum)
JTT:[id number], Royetta Car, PA-C  Cardiologist:  Pt denies  EKG: pt denies past year  Stress test: greater than 10 years ago, after diagnosed with HTN, every thing was within normal limits  ECHO: pt denies ever  Cardiac Cath: pt denies ever  Chest x-ray: pt denies past year, no recent respiratory complications/infections  In 2011 pt had hip surgery done at [facility] for [place] in [place], [place].  The patient got very sick after procedure with nausea/vomiting.  Requested anesthesia records from this visit

## 2017-03-17 ENCOUNTER — Encounter (HOSPITAL_COMMUNITY)
Admission: RE | Admit: 2017-03-17 | Discharge: 2017-03-17 | Disposition: A | Discharge status: Home / Self Care | Payer: No Typology Code available for payment source | Source: Ambulatory Visit | Attending: Orthopedic Surgery | Admitting: Orthopedic Surgery

## 2017-03-17 ENCOUNTER — Encounter (HOSPITAL_COMMUNITY): Payer: Self-pay

## 2017-03-17 ENCOUNTER — Other Ambulatory Visit: Payer: Self-pay

## 2017-03-17 DIAGNOSIS — M1990 Unspecified osteoarthritis, unspecified site: Secondary | ICD-10-CM | POA: Diagnosis not present

## 2017-03-17 DIAGNOSIS — E785 Hyperlipidemia, unspecified: Secondary | ICD-10-CM | POA: Diagnosis not present

## 2017-03-17 DIAGNOSIS — F329 Major depressive disorder, single episode, unspecified: Secondary | ICD-10-CM | POA: Diagnosis not present

## 2017-03-17 DIAGNOSIS — K5900 Constipation, unspecified: Secondary | ICD-10-CM | POA: Diagnosis not present

## 2017-03-17 DIAGNOSIS — Z87891 Personal history of nicotine dependence: Secondary | ICD-10-CM | POA: Diagnosis not present

## 2017-03-17 DIAGNOSIS — K219 Gastro-esophageal reflux disease without esophagitis: Secondary | ICD-10-CM | POA: Diagnosis not present

## 2017-03-17 DIAGNOSIS — I10 Essential (primary) hypertension: Secondary | ICD-10-CM | POA: Diagnosis not present

## 2017-03-17 DIAGNOSIS — Z886 Allergy status to analgesic agent status: Secondary | ICD-10-CM | POA: Diagnosis not present

## 2017-03-17 DIAGNOSIS — Z888 Allergy status to other drugs, medicaments and biological substances status: Secondary | ICD-10-CM | POA: Diagnosis not present

## 2017-03-17 DIAGNOSIS — F419 Anxiety disorder, unspecified: Secondary | ICD-10-CM | POA: Diagnosis not present

## 2017-03-17 DIAGNOSIS — M19011 Primary osteoarthritis, right shoulder: Secondary | ICD-10-CM | POA: Diagnosis not present

## 2017-03-17 DIAGNOSIS — Z79899 Other long term (current) drug therapy: Secondary | ICD-10-CM | POA: Diagnosis not present

## 2017-03-17 HISTORY — DX: Other complications of anesthesia, initial encounter: T88.59XA

## 2017-03-17 HISTORY — DX: Nausea with vomiting, unspecified: R11.2

## 2017-03-17 HISTORY — DX: Adverse effect of unspecified anesthetic, initial encounter: T41.45XA

## 2017-03-17 HISTORY — DX: Other specified postprocedural states: Z98.890

## 2017-03-17 LAB — CBC
HEMATOCRIT: 40.2 % (ref 36.0–46.0)
Hemoglobin: 13.1 g/dL (ref 12.0–15.0)
MCH: 29.8 pg (ref 26.0–34.0)
MCHC: 32.6 g/dL (ref 30.0–36.0)
MCV: 91.4 fL (ref 78.0–100.0)
PLATELETS: 259 10*3/uL (ref 150–400)
RBC: 4.4 MIL/uL (ref 3.87–5.11)
RDW: 12.7 % (ref 11.5–15.5)
WBC: 7.4 10*3/uL (ref 4.0–10.5)

## 2017-03-17 LAB — BASIC METABOLIC PANEL
ANION GAP: 7 (ref 5–15)
BUN: 20 mg/dL (ref 6–20)
CO2: 27 mmol/L (ref 22–32)
Calcium: 9.8 mg/dL (ref 8.9–10.3)
Chloride: 100 mmol/L — ABNORMAL LOW (ref 101–111)
Creatinine, Ser: 0.75 mg/dL (ref 0.44–1.00)
GFR calc non Af Amer: >60 mL/min (ref >60)
Glucose, Bld: 92 mg/dL (ref 65–99)
POTASSIUM: 4.2 mmol/L (ref 3.5–5.1)
SODIUM: 134 mmol/L — AB (ref 135–145)

## 2017-03-17 LAB — SURGICAL PCR SCREEN
MRSA, PCR: NEGATIVE
Staphylococcus aureus: NEGATIVE

## 2017-03-18 MED ORDER — TRANEXAMIC ACID 1000 MG/10ML IV SOLN
1000.0000 mg | INTRAVENOUS | Status: AC
  Administered 2017-03-19: 1000 mg via INTRAVENOUS
  Filled 2017-03-18: qty 1100

## 2017-03-19 ENCOUNTER — Encounter (HOSPITAL_COMMUNITY): Payer: Self-pay | Admitting: *Deleted

## 2017-03-19 ENCOUNTER — Ambulatory Visit (HOSPITAL_COMMUNITY)
Admission: RE | Admit: 2017-03-19 | Discharge: 2017-03-20 | Disposition: A | Discharge status: Home / Self Care | Payer: No Typology Code available for payment source | Source: Ambulatory Visit | Attending: Orthopedic Surgery | Admitting: Orthopedic Surgery

## 2017-03-19 ENCOUNTER — Ambulatory Visit (HOSPITAL_COMMUNITY): Payer: No Typology Code available for payment source | Admitting: Certified Registered"

## 2017-03-19 ENCOUNTER — Ambulatory Visit (HOSPITAL_COMMUNITY): Payer: No Typology Code available for payment source | Admitting: Emergency Medicine

## 2017-03-19 ENCOUNTER — Encounter (HOSPITAL_COMMUNITY)
Admission: RE | Disposition: A | Discharge status: Home / Self Care | Payer: Self-pay | Source: Ambulatory Visit | Attending: Orthopedic Surgery

## 2017-03-19 DIAGNOSIS — F419 Anxiety disorder, unspecified: Secondary | ICD-10-CM | POA: Insufficient documentation

## 2017-03-19 DIAGNOSIS — M19011 Primary osteoarthritis, right shoulder: Secondary | ICD-10-CM | POA: Insufficient documentation

## 2017-03-19 DIAGNOSIS — K5900 Constipation, unspecified: Secondary | ICD-10-CM | POA: Insufficient documentation

## 2017-03-19 DIAGNOSIS — Z87891 Personal history of nicotine dependence: Secondary | ICD-10-CM | POA: Insufficient documentation

## 2017-03-19 DIAGNOSIS — M1990 Unspecified osteoarthritis, unspecified site: Secondary | ICD-10-CM | POA: Insufficient documentation

## 2017-03-19 DIAGNOSIS — Z96611 Presence of right artificial shoulder joint: Secondary | ICD-10-CM

## 2017-03-19 DIAGNOSIS — Z886 Allergy status to analgesic agent status: Secondary | ICD-10-CM | POA: Insufficient documentation

## 2017-03-19 DIAGNOSIS — Z79899 Other long term (current) drug therapy: Secondary | ICD-10-CM | POA: Insufficient documentation

## 2017-03-19 DIAGNOSIS — K219 Gastro-esophageal reflux disease without esophagitis: Secondary | ICD-10-CM | POA: Insufficient documentation

## 2017-03-19 DIAGNOSIS — I10 Essential (primary) hypertension: Secondary | ICD-10-CM | POA: Insufficient documentation

## 2017-03-19 DIAGNOSIS — E785 Hyperlipidemia, unspecified: Secondary | ICD-10-CM | POA: Insufficient documentation

## 2017-03-19 DIAGNOSIS — F329 Major depressive disorder, single episode, unspecified: Secondary | ICD-10-CM | POA: Insufficient documentation

## 2017-03-19 DIAGNOSIS — Z888 Allergy status to other drugs, medicaments and biological substances status: Secondary | ICD-10-CM | POA: Insufficient documentation

## 2017-03-19 HISTORY — PX: TOTAL SHOULDER ARTHROPLASTY: SHX126

## 2017-03-19 SURGERY — ARTHROPLASTY, SHOULDER, TOTAL
Anesthesia: General | Laterality: Right

## 2017-03-19 MED ORDER — DULOXETINE HCL 60 MG PO CPEP
60.0000 mg | ORAL_CAPSULE | Freq: Every day | ORAL | Status: DC
  Filled 2017-03-19: qty 1

## 2017-03-19 MED ORDER — SODIUM CHLORIDE 0.9 % IV SOLN
INTRAVENOUS | Status: DC | PRN
  Administered 2017-03-19: 35 ug/min via INTRAVENOUS

## 2017-03-19 MED ORDER — CHLORHEXIDINE GLUCONATE 4 % EX LIQD: 60.0000 mL | Freq: Once | CUTANEOUS | Status: DC

## 2017-03-19 MED ORDER — ROCURONIUM BROMIDE 10 MG/ML (PF) SYRINGE
PREFILLED_SYRINGE | INTRAVENOUS | Status: AC
  Filled 2017-03-19: qty 5

## 2017-03-19 MED ORDER — ONDANSETRON HCL 4 MG/2ML IJ SOLN
INTRAMUSCULAR | Status: AC
  Filled 2017-03-19: qty 2

## 2017-03-19 MED ORDER — MEPERIDINE HCL 25 MG/ML IJ SOLN: 6.2500 mg | INTRAMUSCULAR | Status: DC | PRN

## 2017-03-19 MED ORDER — PROPOFOL 10 MG/ML IV BOLUS
INTRAVENOUS | Status: DC | PRN
  Administered 2017-03-19: 150 mg via INTRAVENOUS

## 2017-03-19 MED ORDER — METOCLOPRAMIDE HCL 5 MG/ML IJ SOLN: 5.0000 mg | Freq: Three times a day (TID) | INTRAMUSCULAR | Status: DC | PRN

## 2017-03-19 MED ORDER — GLYCOPYRROLATE 0.2 MG/ML IJ SOLN
INTRAMUSCULAR | Status: DC | PRN
  Administered 2017-03-19: .5 mg via INTRAVENOUS

## 2017-03-19 MED ORDER — LACTATED RINGERS IV SOLN
INTRAVENOUS | Status: DC
  Administered 2017-03-19: 18:00:00 via INTRAVENOUS

## 2017-03-19 MED ORDER — LIDOCAINE 2% (20 MG/ML) 5 ML SYRINGE
INTRAMUSCULAR | Status: AC
  Filled 2017-03-19: qty 5

## 2017-03-19 MED ORDER — ROCURONIUM BROMIDE 100 MG/10ML IV SOLN
INTRAVENOUS | Status: DC | PRN
  Administered 2017-03-19: 50 mg via INTRAVENOUS

## 2017-03-19 MED ORDER — BISACODYL 5 MG PO TBEC: 5.0000 mg | DELAYED_RELEASE_TABLET | Freq: Every day | ORAL | Status: DC | PRN

## 2017-03-19 MED ORDER — AMLODIPINE BESYLATE 5 MG PO TABS
5.0000 mg | ORAL_TABLET | Freq: Every day | ORAL | Status: DC
  Administered 2017-03-20: 5 mg via ORAL
  Filled 2017-03-19: qty 1

## 2017-03-19 MED ORDER — CEFAZOLIN SODIUM-DEXTROSE 2-4 GM/100ML-% IV SOLN
2.0000 g | Freq: Four times a day (QID) | INTRAVENOUS | Status: AC
  Administered 2017-03-19 – 2017-03-20 (×3): 2 g via INTRAVENOUS
  Filled 2017-03-19 (×3): qty 100

## 2017-03-19 MED ORDER — PROPOFOL 10 MG/ML IV BOLUS
INTRAVENOUS | Status: AC
  Filled 2017-03-19: qty 20

## 2017-03-19 MED ORDER — METOCLOPRAMIDE HCL 5 MG PO TABS: 5.0000 mg | ORAL_TABLET | Freq: Three times a day (TID) | ORAL | Status: DC | PRN

## 2017-03-19 MED ORDER — KETOROLAC TROMETHAMINE 15 MG/ML IJ SOLN
15.0000 mg | Freq: Four times a day (QID) | INTRAMUSCULAR | Status: AC
  Administered 2017-03-19 – 2017-03-20 (×4): 15 mg via INTRAVENOUS
  Filled 2017-03-19 (×3): qty 1

## 2017-03-19 MED ORDER — CEFAZOLIN SODIUM-DEXTROSE 2-4 GM/100ML-% IV SOLN
2.0000 g | INTRAVENOUS | Status: AC
  Administered 2017-03-19: 2 g via INTRAVENOUS

## 2017-03-19 MED ORDER — PHENYLEPHRINE HCL 10 MG/ML IJ SOLN
INTRAMUSCULAR | Status: DC | PRN
  Administered 2017-03-19 (×3): 120 ug via INTRAVENOUS

## 2017-03-19 MED ORDER — LACTATED RINGERS IV SOLN
INTRAVENOUS | Status: DC
  Administered 2017-03-19: 09:00:00 via INTRAVENOUS

## 2017-03-19 MED ORDER — FENTANYL CITRATE (PF) 100 MCG/2ML IJ SOLN
INTRAMUSCULAR | Status: AC
  Administered 2017-03-19: 50 ug via INTRAVENOUS
  Filled 2017-03-19: qty 2

## 2017-03-19 MED ORDER — NEOSTIGMINE METHYLSULFATE 5 MG/5ML IV SOSY
PREFILLED_SYRINGE | INTRAVENOUS | Status: AC
  Filled 2017-03-19: qty 5

## 2017-03-19 MED ORDER — DEXAMETHASONE SODIUM PHOSPHATE 4 MG/ML IJ SOLN
INTRAMUSCULAR | Status: DC | PRN
  Administered 2017-03-19: 10 mg via INTRAVENOUS

## 2017-03-19 MED ORDER — LIDOCAINE HCL (CARDIAC) 20 MG/ML IV SOLN
INTRAVENOUS | Status: DC | PRN
  Administered 2017-03-19: 100 mg via INTRAVENOUS

## 2017-03-19 MED ORDER — ONDANSETRON HCL 4 MG PO TABS: 4.0000 mg | ORAL_TABLET | Freq: Four times a day (QID) | ORAL | Status: DC | PRN

## 2017-03-19 MED ORDER — LINACLOTIDE 145 MCG PO CAPS
145.0000 ug | ORAL_CAPSULE | Freq: Every day | ORAL | Status: DC
  Administered 2017-03-20: 145 ug via ORAL
  Filled 2017-03-19: qty 1

## 2017-03-19 MED ORDER — FENTANYL CITRATE (PF) 100 MCG/2ML IJ SOLN: 25.0000 ug | INTRAMUSCULAR | Status: DC | PRN

## 2017-03-19 MED ORDER — NEOSTIGMINE METHYLSULFATE 10 MG/10ML IV SOLN
INTRAVENOUS | Status: DC | PRN
  Administered 2017-03-19: 2.5 mg via INTRAVENOUS

## 2017-03-19 MED ORDER — POLYETHYLENE GLYCOL 3350 17 G PO PACK: 17.0000 g | PACK | Freq: Every day | ORAL | Status: DC | PRN

## 2017-03-19 MED ORDER — HYDROMORPHONE HCL 1 MG/ML IJ SOLN
0.5000 mg | INTRAMUSCULAR | Status: DC | PRN
  Administered 2017-03-20: 0.5 mg via INTRAVENOUS
  Filled 2017-03-19: qty 1

## 2017-03-19 MED ORDER — ACETAMINOPHEN 650 MG RE SUPP: 650.0000 mg | RECTAL | Status: DC | PRN

## 2017-03-19 MED ORDER — FENTANYL CITRATE (PF) 100 MCG/2ML IJ SOLN
INTRAMUSCULAR | Status: DC | PRN
  Administered 2017-03-19: 25 ug via INTRAVENOUS
  Administered 2017-03-19: 100 ug via INTRAVENOUS

## 2017-03-19 MED ORDER — DIAZEPAM 5 MG PO TABS
2.5000 mg | ORAL_TABLET | Freq: Four times a day (QID) | ORAL | Status: DC | PRN
  Administered 2017-03-19 – 2017-03-20 (×2): 5 mg via ORAL
  Filled 2017-03-19 (×2): qty 1

## 2017-03-19 MED ORDER — FENTANYL CITRATE (PF) 100 MCG/2ML IJ SOLN
50.0000 ug | Freq: Once | INTRAMUSCULAR | Status: AC
  Administered 2017-03-19: 50 ug via INTRAVENOUS

## 2017-03-19 MED ORDER — FLEET ENEMA 7-19 GM/118ML RE ENEM: 1.0000 | ENEMA | Freq: Once | RECTAL | Status: DC | PRN

## 2017-03-19 MED ORDER — SCOPOLAMINE 1 MG/3DAYS TD PT72
1.0000 | MEDICATED_PATCH | TRANSDERMAL | Status: DC
  Administered 2017-03-19: 1.5 mg via TRANSDERMAL

## 2017-03-19 MED ORDER — DIPHENHYDRAMINE HCL 12.5 MG/5ML PO ELIX: 12.5000 mg | ORAL_SOLUTION | ORAL | Status: DC | PRN

## 2017-03-19 MED ORDER — ONDANSETRON HCL 4 MG/2ML IJ SOLN
INTRAMUSCULAR | Status: DC | PRN
  Administered 2017-03-19 (×2): 4 mg via INTRAVENOUS

## 2017-03-19 MED ORDER — PHENOL 1.4 % MT LIQD: 1.0000 | OROMUCOSAL | Status: DC | PRN

## 2017-03-19 MED ORDER — 0.9 % SODIUM CHLORIDE (POUR BTL) OPTIME
TOPICAL | Status: DC | PRN
  Administered 2017-03-19: 1000 mL

## 2017-03-19 MED ORDER — DEXAMETHASONE SODIUM PHOSPHATE 10 MG/ML IJ SOLN
INTRAMUSCULAR | Status: AC
  Filled 2017-03-19: qty 1

## 2017-03-19 MED ORDER — ONDANSETRON HCL 4 MG/2ML IJ SOLN: 4.0000 mg | Freq: Four times a day (QID) | INTRAMUSCULAR | Status: DC | PRN

## 2017-03-19 MED ORDER — OXYCODONE HCL 5 MG PO TABS
10.0000 mg | ORAL_TABLET | ORAL | Status: DC | PRN
  Administered 2017-03-19: 10 mg via ORAL
  Filled 2017-03-19: qty 2

## 2017-03-19 MED ORDER — OXYCODONE HCL 5 MG PO TABS: 5.0000 mg | ORAL_TABLET | ORAL | Status: DC | PRN

## 2017-03-19 MED ORDER — LACTATED RINGERS IV SOLN: INTRAVENOUS | Status: DC

## 2017-03-19 MED ORDER — FENTANYL CITRATE (PF) 250 MCG/5ML IJ SOLN
INTRAMUSCULAR | Status: AC
  Filled 2017-03-19: qty 5

## 2017-03-19 MED ORDER — MENTHOL 3 MG MT LOZG: 1.0000 | LOZENGE | OROMUCOSAL | Status: DC | PRN

## 2017-03-19 MED ORDER — METOCLOPRAMIDE HCL 5 MG/ML IJ SOLN: 10.0000 mg | Freq: Once | INTRAMUSCULAR | Status: DC | PRN

## 2017-03-19 MED ORDER — ACETAMINOPHEN 325 MG PO TABS: 650.0000 mg | ORAL_TABLET | ORAL | Status: DC | PRN

## 2017-03-19 MED ORDER — ALUM & MAG HYDROXIDE-SIMETH 200-200-20 MG/5ML PO SUSP: 30.0000 mL | ORAL | Status: DC | PRN

## 2017-03-19 MED ORDER — DOCUSATE SODIUM 100 MG PO CAPS
100.0000 mg | ORAL_CAPSULE | Freq: Two times a day (BID) | ORAL | Status: DC
  Administered 2017-03-19 – 2017-03-20 (×2): 100 mg via ORAL
  Filled 2017-03-19 (×2): qty 1

## 2017-03-19 MED ORDER — MIDAZOLAM HCL 2 MG/2ML IJ SOLN
INTRAMUSCULAR | Status: AC
  Administered 2017-03-19: 1 mg via INTRAVENOUS
  Filled 2017-03-19: qty 2

## 2017-03-19 MED ORDER — ROPIVACAINE HCL 5 MG/ML IJ SOLN
INTRAMUSCULAR | Status: DC | PRN
  Administered 2017-03-19: 30 mL via EPIDURAL

## 2017-03-19 MED ORDER — KETOROLAC TROMETHAMINE 15 MG/ML IJ SOLN
INTRAMUSCULAR | Status: AC
  Administered 2017-03-19: 15 mg via INTRAVENOUS
  Filled 2017-03-19: qty 1

## 2017-03-19 MED ORDER — MIDAZOLAM HCL 2 MG/2ML IJ SOLN
1.0000 mg | Freq: Once | INTRAMUSCULAR | Status: AC
  Administered 2017-03-19: 1 mg via INTRAVENOUS

## 2017-03-19 SURGICAL SUPPLY — 55 items
BLADE SAW SGTL 83.5X18.5 (BLADE) ×2 IMPLANT
CEMENT BONE DEPUY (Cement) ×2 IMPLANT
COVER SURGICAL LIGHT HANDLE (MISCELLANEOUS) ×2 IMPLANT
DERMABOND ADHESIVE PROPEN (GAUZE/BANDAGES/DRESSINGS) ×1
DERMABOND ADVANCED .7 DNX6 (GAUZE/BANDAGES/DRESSINGS) ×1 IMPLANT
DRAPE ORTHO SPLIT 77X108 STRL (DRAPES) ×2
DRAPE SURG 17X11 SM STRL (DRAPES) ×2 IMPLANT
DRAPE SURG ORHT 6 SPLT 77X108 (DRAPES) ×2 IMPLANT
DRAPE U-SHAPE 47X51 STRL (DRAPES) ×2 IMPLANT
DRSG AQUACEL AG ADV 3.5X10 (GAUZE/BANDAGES/DRESSINGS) ×2 IMPLANT
DURAPREP 26ML APPLICATOR (WOUND CARE) ×2 IMPLANT
ELECT BLADE 4.0 EZ CLEAN MEGAD (MISCELLANEOUS) ×2
ELECT CAUTERY BLADE 6.4 (BLADE) ×2 IMPLANT
ELECT REM PT RETURN 9FT ADLT (ELECTROSURGICAL) ×2
ELECTRODE BLDE 4.0 EZ CLN MEGD (MISCELLANEOUS) ×1 IMPLANT
ELECTRODE REM PT RTRN 9FT ADLT (ELECTROSURGICAL) ×1 IMPLANT
FACESHIELD WRAPAROUND (MASK) ×6 IMPLANT
GLENOID WITH CLEAT SM (Miscellaneous) ×2 IMPLANT
GLOVE BIO SURGEON STRL SZ7.5 (GLOVE) ×2 IMPLANT
GLOVE BIO SURGEON STRL SZ8 (GLOVE) ×2 IMPLANT
GLOVE EUDERMIC 7 POWDERFREE (GLOVE) ×2 IMPLANT
GLOVE SS BIOGEL STRL SZ 7.5 (GLOVE) ×1 IMPLANT
GLOVE SUPERSENSE BIOGEL SZ 7.5 (GLOVE) ×1
GOWN STRL REUS W/ TWL LRG LVL3 (GOWN DISPOSABLE) ×1 IMPLANT
GOWN STRL REUS W/ TWL XL LVL3 (GOWN DISPOSABLE) ×2 IMPLANT
GOWN STRL REUS W/TWL LRG LVL3 (GOWN DISPOSABLE) ×1
GOWN STRL REUS W/TWL XL LVL3 (GOWN DISPOSABLE) ×2
HEAD HUMERAL UNIVERSE 42X17 (Head) ×2 IMPLANT
KIT BASIN OR (CUSTOM PROCEDURE TRAY) ×2 IMPLANT
KIT ROOM TURNOVER OR (KITS) ×2 IMPLANT
KIT SET UNIVERSAL (KITS) ×2 IMPLANT
MANIFOLD NEPTUNE II (INSTRUMENTS) ×2 IMPLANT
NDL SUT .5 MAYO 1.404X.05X (NEEDLE) ×1 IMPLANT
NDL SUT 6 .5 CRC .975X.05 MAYO (NEEDLE) ×1 IMPLANT
NEEDLE MAYO TAPER (NEEDLE) ×2
NS IRRIG 1000ML POUR BTL (IV SOLUTION) ×2 IMPLANT
PACK SHOULDER (CUSTOM PROCEDURE TRAY) ×2 IMPLANT
PAD ARMBOARD 7.5X6 YLW CONV (MISCELLANEOUS) ×4 IMPLANT
RESTRAINT HEAD UNIVERSAL NS (MISCELLANEOUS) ×2 IMPLANT
SLING ARM FOAM STRAP LRG (SOFTGOODS) IMPLANT
SLING ARM XL FOAM STRAP (SOFTGOODS) ×2 IMPLANT
SMARTMIX MINI TOWER (MISCELLANEOUS) ×2
SPONGE LAP 18X18 X RAY DECT (DISPOSABLE) ×2 IMPLANT
SPONGE LAP 4X18 X RAY DECT (DISPOSABLE) ×2 IMPLANT
STEM APEX UNIVERSAL 6MM SHOULD (Stem) ×2 IMPLANT
SUCTION FRAZIER HANDLE 10FR (MISCELLANEOUS) ×1
SUCTION TUBE FRAZIER 10FR DISP (MISCELLANEOUS) ×1 IMPLANT
SUT FIBERWIRE #2 38 T-5 BLUE (SUTURE) ×8
SUT MNCRL AB 3-0 PS2 18 (SUTURE) ×2 IMPLANT
SUT MON AB 2-0 CT1 36 (SUTURE) ×2 IMPLANT
SUT VIC AB 1 CT1 27 (SUTURE) ×1
SUT VIC AB 1 CT1 27XBRD ANBCTR (SUTURE) ×1 IMPLANT
SUTURE FIBERWR #2 38 T-5 BLUE (SUTURE) ×4 IMPLANT
SYR CONTROL 10ML LL (SYRINGE) IMPLANT
TOWER SMARTMIX MINI (MISCELLANEOUS) ×1 IMPLANT

## 2017-03-19 NOTE — Anesthesia Postprocedure Evaluation (Signed)
Anesthesia Post Note  Patient: Jillian Stewart  Procedure(s) Performed: TOTAL SHOULDER ARTHROPLASTY (Right )     Patient location during evaluation: PACU Anesthesia Type: General Level of consciousness: awake and alert Pain management: pain level controlled Vital Signs Assessment: post-procedure vital signs reviewed and stable Respiratory status: spontaneous breathing, nonlabored ventilation, respiratory function stable and patient connected to nasal cannula oxygen Cardiovascular status: blood pressure returned to baseline and stable Postop Assessment: no apparent nausea or vomiting Anesthetic complications: no    Last Vitals:  Vitals:   03/19/17 1400 03/19/17 1415  BP: 133/75 132/73  Pulse: 88 84  Resp: 20 18  Temp:    SpO2: 97% 100%    Last Pain:  Vitals:   03/19/17 1415  TempSrc:   PainSc: 0-No pain                 Montez Hageman

## 2017-03-19 NOTE — Anesthesia Procedure Notes (Signed)
Anesthesia Regional Block: Supraclavicular block   Pre-Anesthetic Checklist: ,, timeout performed, Correct Patient, Correct Site, Correct Laterality, Correct Procedure, Correct Position, site marked, Risks and benefits discussed,  Surgical consent,  Pre-op evaluation,  At surgeon's request and post-op pain management  Laterality: Right and Upper  Prep: Maximum Sterile Barrier Precautions used, chloraprep       Needles:  Injection technique: Single-shot  Needle Type: Echogenic Stimulator Needle     Needle Length: 10cm      Additional Needles:   Procedures:,,,, ultrasound used (permanent image in chart),,,,  Narrative:  Start time: 03/19/2017 9:31 AM End time: 03/19/2017 9:41 AM Injection made incrementally with aspirations every 5 mL.  Performed by: Personally  Anesthesiologist: Montez Hageman, MD  Additional Notes: Risks, benefits and alternative to block explained extensively.  Patient tolerated procedure well, without complications.

## 2017-03-19 NOTE — Op Note (Signed)
03/19/2017  12:54 PM  PATIENT:   Jillian Stewart  62 y.o. female  PRE-OPERATIVE DIAGNOSIS:  Right shoulder osteoarthritis  POST-OPERATIVE DIAGNOSIS:  same  PROCEDURE:  R TSA #6 stem, 42/17 head, small glenoid  SURGEON:  Jatavia Keltner, Metta Clines M.D.  ASSISTANTS: [place] pac   ANESTHESIA:   GET + ISB  EBL: 150  SPECIMEN:  none  Drains: none   PATIENT DISPOSITION:  PACU - hemodynamically stable.    PLAN OF CARE: Admit for overnight observation  Dictation# [id number]   Contact # [phone]

## 2017-03-19 NOTE — Anesthesia Procedure Notes (Signed)
Procedure Name: Intubation Performed by: Verita Lamb, CRNA Pre-anesthesia Checklist: Patient identified, Emergency Drugs available, Suction available and Patient being monitored Patient Re-evaluated:Patient Re-evaluated prior to induction Preoxygenation: Pre-oxygenation with 100% oxygen Induction Type: IV induction Ventilation: Mask ventilation without difficulty Laryngoscope Size: Mac and 3 Grade View: Grade I Tube type: Oral Tube size: 7.0 mm Number of attempts: 1 Airway Equipment and Method: Stylet and Bougie stylet Placement Confirmation: ETT inserted through vocal cords under direct vision,  positive ETCO2,  CO2 detector and breath sounds checked- equal and bilateral Secured at: 21 cm Tube secured with: Tape Dental Injury: Teeth and Oropharynx as per pre-operative assessment  Difficulty Due To: Difficulty was unanticipated Comments: Grade IV view without cricoid pressure with mac 3 blade,  Head lift and cricoid pressure provided a Grade II view, used bougie stylett to intubate.  bbs +etco2

## 2017-03-19 NOTE — Discharge Instructions (Signed)

## 2017-03-19 NOTE — H&P (Signed)
Jillian Stewart    Chief Complaint: Right shoulder osteoarthritis HPI: The patient is a 62 y.o. female with end stage right shoudler OA  Past Medical History:  Diagnosis Date  . Anxiety   . Arthritis   . Chronic constipation   . Colon polyps   . Complication of anesthesia   . Depression   . GERD (gastroesophageal reflux disease)   . Hyperlipidemia   . Hypertension   . PONV (postoperative nausea and vomiting)     Past Surgical History:  Procedure Laterality Date  . BREAST SURGERY Right   . HIP SURGERY     right and left.    . OVARIAN CYST SURGERY Left   . RHINOPLASTY      Family History  Problem Relation Age of Onset  . Cancer Mother        cholangiocarcinoma  . Stroke Father   . Diabetes Maternal Grandmother     Social History:  reports that she quit smoking about 14 years ago. Her smoking use included cigarettes. she has never used smokeless tobacco. She reports that she drinks about 0.6 - 1.2 oz of alcohol per week. She reports that she does not use drugs.   Medications Prior to Admission  Medication Sig Dispense Refill  . acetaminophen (TYLENOL) 500 MG tablet Take 1,000 mg by mouth every 6 (six) hours as needed for moderate pain.    Marland Kitchen amLODipine (NORVASC) 5 MG tablet Take 1 tablet (5 mg total) by mouth daily. 90 tablet 3  . BIOTIN PO Take 1 tablet by mouth daily.    . Calcium-Magnesium-Vitamin D (CALCIUM 500 PO) Take 500 mg by mouth daily.    . cholecalciferol (VITAMIN D) 1000 units tablet Take 1,000 Units by mouth daily.    . DULoxetine (CYMBALTA) 60 MG capsule Take 1 capsule (60 mg total) by mouth daily. 90 capsule 3  . linaclotide (LINZESS) 145 MCG CAPS capsule Take 1 capsule (145 mcg total) by mouth daily before breakfast. 30 capsule 2  . Multiple Vitamins-Minerals (MULTIVITAMINS) CHEW Chew 1 tablet by mouth daily.    . naproxen sodium (ALEVE) 220 MG tablet Take 440 mg by mouth daily.    . Probiotic Product (DIGESTIVE ADVANTAGE PO) Take 1 capsule by mouth daily.     . traMADol (ULTRAM) 50 MG tablet Take 50 mg by mouth daily as needed for moderate pain.   0  . clotrimazole-betamethasone (LOTRISONE) cream Apply 1 application topically 2 (two) times daily. (Patient not taking: Reported on 02/09/2017) 30 g 0  . fluticasone (FLONASE) 50 MCG/ACT nasal spray One spray in each nostril twice a day, use left hand for right nostril, and right hand for left nostril. (Patient not taking: Reported on 02/09/2017) 48 g 3  . lubiprostone (AMITIZA) 8 MCG capsule Take 1 capsule (8 mcg total) by mouth 2 (two) times daily with a meal. (Patient not taking: Reported on 03/12/2017) 60 capsule 2     Physical Exam: right shoulder with painful and restricted motion as noted at recent office visits  Vitals  Temp:  [98 F (36.7 C)] 98 F (36.7 C) (01/10 0822) Pulse Rate:  [68-83] 79 (01/10 0940) Resp:  [13-18] 13 (01/10 0940) BP: (142-149)/(73-89) 142/73 (01/10 0940) SpO2:  [99 %-100 %] 99 % (01/10 0940) Weight:  [57.8 kg (127 lb 6.4 oz)] 57.8 kg (127 lb 6.4 oz) (01/10 0175)  Assessment/Plan  Impression: Right shoulder osteoarthritis  Plan of Action: Procedure(s): TOTAL SHOULDER ARTHROPLASTY  [name] [name] 03/19/2017, 10:14 AM Contact # (  [phone]    

## 2017-03-19 NOTE — Transfer of Care (Signed)
Immediate Anesthesia Transfer of Care Note  Patient: Jillian Stewart  Procedure(s) Performed: TOTAL SHOULDER ARTHROPLASTY (Right )  Patient Location: PACU  Anesthesia Type:General  Level of Consciousness: awake, alert  and oriented  Airway & Oxygen Therapy: Patient Spontanous Breathing and Patient connected to face mask oxygen  Post-op Assessment: Report given to RN and Post -op Vital signs reviewed and stable  Post vital signs: Reviewed and stable  Last Vitals:  Vitals:   03/19/17 0935 03/19/17 0940  BP: (!) 149/76 (!) 142/73  Pulse: 83 79  Resp: 17 13  Temp:    SpO2: 100% 99%    Last Pain:  Vitals:   03/19/17 0940  TempSrc:   PainSc: 0-No pain      Patients Stated Pain Goal: 3 (11/88/67 7373)  Complications: No apparent anesthesia complications

## 2017-03-19 NOTE — Anesthesia Procedure Notes (Signed)
Procedures

## 2017-03-19 NOTE — Anesthesia Preprocedure Evaluation (Signed)
Anesthesia Evaluation  Patient identified by MRN, date of birth, ID band Patient awake    Reviewed: Allergy & Precautions, NPO status , Patient's Chart, lab work & pertinent test results  History of Anesthesia Complications (+) PONV  Airway Mallampati: II  TM Distance: >3 FB Neck ROM: Full    Dental no notable dental hx.    Pulmonary former smoker,    Pulmonary exam normal breath sounds clear to auscultation       Cardiovascular hypertension, Pt. on medications Normal cardiovascular exam Rhythm:Regular Rate:Normal     Neuro/Psych Anxiety Depression negative neurological ROS     GI/Hepatic Neg liver ROS, GERD  Medicated and Controlled,  Endo/Other  negative endocrine ROS  Renal/GU negative Renal ROS  negative genitourinary   Musculoskeletal  (+) Arthritis ,   Abdominal   Peds negative pediatric ROS (+)  Hematology negative hematology ROS (+)   Anesthesia Other Findings   Reproductive/Obstetrics negative OB ROS                             Anesthesia Physical Anesthesia Plan  ASA: II  Anesthesia Plan: General   Post-op Pain Management:  Regional for Post-op pain   Induction: Intravenous  PONV Risk Score and Plan: 4 or greater and Ondansetron, Dexamethasone, Midazolam and Scopolamine patch - Pre-op  Airway Management Planned: Oral ETT  Additional Equipment:   Intra-op Plan:   Post-operative Plan: Extubation in OR  Informed Consent: I have reviewed the patients History and Physical, chart, labs and discussed the procedure including the risks, benefits and alternatives for the proposed anesthesia with the patient or authorized representative who has indicated his/her understanding and acceptance.   Dental advisory given  Plan Discussed with: CRNA  Anesthesia Plan Comments:         Anesthesia Quick Evaluation

## 2017-03-20 ENCOUNTER — Encounter (HOSPITAL_COMMUNITY): Payer: Self-pay | Admitting: Orthopedic Surgery

## 2017-03-20 DIAGNOSIS — M19011 Primary osteoarthritis, right shoulder: Secondary | ICD-10-CM | POA: Diagnosis not present

## 2017-03-20 MED ORDER — ONDANSETRON HCL 4 MG PO TABS: 4.0000 mg | ORAL_TABLET | Freq: Three times a day (TID) | ORAL | 0 refills | Status: DC | PRN

## 2017-03-20 MED ORDER — DIAZEPAM 5 MG PO TABS: 2.5000 mg | ORAL_TABLET | Freq: Four times a day (QID) | ORAL | 1 refills | Status: DC | PRN

## 2017-03-20 MED ORDER — HYDROMORPHONE HCL 2 MG PO TABS: 2.0000 mg | ORAL_TABLET | ORAL | 0 refills | Status: DC | PRN

## 2017-03-20 MED ORDER — HYDROMORPHONE HCL 2 MG PO TABS
2.0000 mg | ORAL_TABLET | ORAL | Status: DC | PRN
  Administered 2017-03-20: 2 mg via ORAL
  Filled 2017-03-20: qty 1

## 2017-03-20 MED FILL — ONDANSETRON HCL 4 MG TABLET: 4 | 6 days supply | Qty: 20 | Fill #0

## 2017-03-20 MED FILL — HYDROmorphone HCL 2 MG TABS: 2 | 6 days supply | Qty: 40 | Fill #0

## 2017-03-20 MED FILL — diazePAM 5 MG TABS: 5 | 10 days supply | Qty: 40 | Fill #0

## 2017-03-20 NOTE — Evaluation (Signed)
Occupational Therapy Evaluation Patient Details Name: Jillian Stewart MRN: [id number] DOB: Oct 25, 1955 Today's Date: 03/20/2017    History of Present Illness 62 yo female s/p R TSA   Clinical Impression   Patient evaluated by Occupational Therapy with no further acute OT needs identified. All education has been completed and the patient has no further questions. See below for any follow-up Occupational Therapy or equipment needs. OT to sign off. Thank you for referral.      Follow Up Recommendations  No OT follow up    Equipment Recommendations  None recommended by OT    Recommendations for Other Services       Precautions / Restrictions Precautions Precautions: Shoulder Type of Shoulder Precautions: passive Shoulder Interventions: Off for dressing/bathing/exercises Precaution Comments: handout provided for pendulums and adls with shoulder precautions Restrictions Weight Bearing Restrictions: Yes RUE Weight Bearing: Non weight bearing      Mobility Bed Mobility Overal bed mobility: Modified Independent             General bed mobility comments: able to pivot on buttock and avoid pressure on R UE  Transfers Overall transfer level: Needs assistance   Transfers: Sit to/from Stand Sit to Stand: Supervision              Balance                                           ADL either performed or assessed with clinical judgement   ADL Overall ADL's : Needs assistance/impaired Eating/Feeding: Set up Eating/Feeding Details (indicate cue type and reason): requires (A) due to R UE surg Grooming: Set up   Upper Body Bathing: Minimal assistance   Lower Body Bathing: Minimal assistance           Toilet Transfer: Min Educational psychologist Details (indicate cue type and reason): educated on positioning and saftey   General ADL Comments: educated on clean linen for bathing/ educated on method for dressing/ educated on exercises  and spouse present. educated on movements for adls only      Vision Baseline Vision/History: Wears glasses Wears Glasses: Reading only       Environmental education officer      Pertinent Vitals/Pain Pain Assessment: Faces Faces Pain Scale: Hurts even more Pain Location: shoulder Pain Descriptors / Indicators: Operative site guarding Pain Intervention(s): Monitored during session;Premedicated before session;Repositioned;Ice applied     Hand Dominance Right   Extremity/Trunk Assessment Upper Extremity Assessment Upper Extremity Assessment: RUE deficits/detail RUE Deficits / Details: s/p surg   Lower Extremity Assessment Lower Extremity Assessment: Overall WFL for tasks assessed   Cervical / Trunk Assessment Cervical / Trunk Assessment: Normal   Communication Communication Communication: No difficulties   Cognition Arousal/Alertness: Awake/alert Behavior During Therapy: WFL for tasks assessed/performed                                       General Comments  dressing dry and intact    Exercises Exercises: Shoulder Shoulder Exercises Pendulum Exercise: PROM;Right;10 reps;Standing Elbow Flexion: AROM Wrist Flexion: AROM Digit Composite Flexion: AROM   Shoulder Instructions Shoulder Instructions Donning/doffing shirt without moving shoulder: Patient able to independently direct caregiver;Minimal assistance Method for sponge bathing under operated UE: Minimal assistance Donning/doffing  sling/immobilizer: Minimal assistance Correct positioning of sling/immobilizer: Minimal assistance Pendulum exercises (written home exercise program): Minimal assistance ROM for elbow, wrist and digits of operated UE: Modified independent Sling wearing schedule (on at all times/off for ADL's): Modified independent Positioning of UE while sleeping: Minimal assistance    Home Living Family/patient expects to be discharged to:: Private residence Living Arrangements:  Spouse/significant other Available Help at Discharge: Family Type of Home: House             Bathroom Shower/Tub: Walk-in Psychologist, prison and probation services: Standard         Additional Comments: pt's spouse works for [employer] and will be taking a week off to be home with patient      Prior Functioning/Environment Level of Independence: Independent                 OT Problem List:        OT Treatment/Interventions:      OT Goals(Current goals can be found in the care plan section)    OT Frequency:     Barriers to D/C:            Co-evaluation              AM-PAC PT "6 Clicks" Daily Activity     Outcome Measure Help from another person eating meals?: None Help from another person taking care of personal grooming?: A Little Help from another person toileting, which includes using toliet, bedpan, or urinal?: A Little Help from another person bathing (including washing, rinsing, drying)?: A Little Help from another person to put on and taking off regular upper body clothing?: A Little Help from another person to put on and taking off regular lower body clothing?: A Little 6 Click Score: 19   End of Session Nurse Communication: Mobility status;Precautions  Activity Tolerance: Patient tolerated treatment well Patient left: in chair;with call bell/phone within reach;with family/visitor present                   Time: 7824-2353 OT Time Calculation (min): 40 min Charges:  OT General Charges $OT Visit: 1 Visit OT Evaluation $OT Eval Moderate Complexity: 1 Mod OT Treatments $Self Care/Home Management : 23-37 mins G-Codes:      Jeri Modena   OTR/L Pager: [phone] Office: [phone] .   Parke Poisson B 03/20/2017, 4:20 PM

## 2017-03-20 NOTE — Discharge Summary (Signed)
PATIENT ID:      Jillian Stewart  MRN:     [id number] DOB/AGE:    64-01-1956 / 64 y.o.     DISCHARGE SUMMARY  ADMISSION DATE:    03/19/2017 DISCHARGE DATE:    ADMISSION DIAGNOSIS: Right shoulder osteoarthritis Past Medical History:  Diagnosis Date  . Anxiety   . Arthritis   . Chronic constipation   . Colon polyps   . Complication of anesthesia   . Depression   . GERD (gastroesophageal reflux disease)   . Hyperlipidemia   . Hypertension   . PONV (postoperative nausea and vomiting)     DISCHARGE DIAGNOSIS:   Active Problems:   S/P shoulder replacement, right   PROCEDURE: Procedure(s): TOTAL SHOULDER ARTHROPLASTY on 03/19/2017  CONSULTS:    HISTORY:  See H&P in chart.  HOSPITAL COURSE:  Jillian Stewart is a 62 y.o. admitted on 03/19/2017 with a diagnosis of Right shoulder osteoarthritis.  They were brought to the operating room on 03/19/2017 and underwent Procedure(s): TOTAL SHOULDER ARTHROPLASTY.    They were given perioperative antibiotics:  Anti-infectives (From admission, onward)   Start     Dose/Rate Route Frequency Ordered Stop   03/19/17 1800  ceFAZolin (ANCEF) IVPB 2g/100 mL premix     2 g 200 mL/hr over 30 Minutes Intravenous Every 6 hours 03/19/17 1709 03/20/17 0558   03/19/17 0839  ceFAZolin (ANCEF) IVPB 2g/100 mL premix     2 g 200 mL/hr over 30 Minutes Intravenous On call to O.R. 03/19/17 8099 03/19/17 1114    .  Patient underwent the above named procedure and tolerated it well. The following day they were hemodynamically stable and pain was controlled on oral analgesics. They were neurovascularly intact to the operative extremity. OT was ordered and worked with patient per protocol. They were medically and orthopaedically stable for discharge on day 1 .    DIAGNOSTIC STUDIES:  RECENT RADIOGRAPHIC STUDIES :  No results found.  RECENT VITAL SIGNS:   Patient Vitals for the past 24 hrs:  BP Temp Temp src Pulse Resp SpO2 Height Weight  03/20/17 0424 (!) 101/58  98.8 F (37.1 C) Oral 80 16 93 % - -  03/19/17 2109 136/78 98.9 F (37.2 C) Oral 73 18 100 % - -  03/19/17 1620 126/82 (!) 97.5 F (36.4 C) - 82 - 96 % - -  03/19/17 1530 121/73 - - 86 18 98 % - -  03/19/17 1445 124/67 - - 90 19 98 % - -  03/19/17 1415 132/73 - - 84 18 100 % - -  03/19/17 1400 133/75 - - 88 20 97 % - -  03/19/17 1340 (!) 149/88 - - 87 17 97 % - -  03/19/17 1330 (!) 143/83 - - 89 14 97 % - -  03/19/17 1329 - 98.3 F (36.8 C) - - - - - -  03/19/17 1020 (!) 149/73 - - 77 20 99 % - -  03/19/17 0950 (!) 156/77 - - 77 15 99 % - -  03/19/17 0940 (!) 142/73 - - 79 13 99 % - -  03/19/17 0935 (!) 149/76 - - 83 17 100 % - -  03/19/17 0930 (!) 148/88 - - 74 15 100 % - -  03/19/17 0925 - - - 73 16 100 % - -  03/19/17 8338 (!) 143/89 98 F (36.7 C) Oral 68 18 99 % 5\' 3"  (1.6 m) 57.8 kg (127 lb 6.4 oz)  .  RECENT EKG RESULTS:  Orders placed or performed in visit on 03/17/17  . EKG 12-Lead  . EKG 12-Lead  . EKG 12-Lead    DISCHARGE INSTRUCTIONS:    DISCHARGE MEDICATIONS:   Allergies as of 03/20/2017      Reactions   Other Other (See Comments)   Anesthesia - sick for 3 days    Statins Other (See Comments)   Severe muscle cramps. Pravastatin, Simvastatin, Atorvastatin, & Livalo   Welchol [colesevelam] Other (See Comments)   GI issues.    Zetia [ezetimibe] Other (See Comments)   Muscle cramps      Medication List    STOP taking these medications   traMADol 50 MG tablet Commonly known as:  ULTRAM     TAKE these medications   acetaminophen 500 MG tablet Commonly known as:  TYLENOL Take 1,000 mg by mouth every 6 (six) hours as needed for moderate pain.   amLODipine 5 MG tablet Commonly known as:  NORVASC Take 1 tablet (5 mg total) by mouth daily.   BIOTIN PO Take 1 tablet by mouth daily.   CALCIUM 500 PO Take 500 mg by mouth daily.   cholecalciferol 1000 units tablet Commonly known as:  VITAMIN D Take 1,000 Units by mouth daily.    clotrimazole-betamethasone cream Commonly known as:  LOTRISONE Apply 1 application topically 2 (two) times daily.   diazepam 5 MG tablet Commonly known as:  VALIUM Take 0.5-1 tablets (2.5-5 mg total) by mouth every 6 (six) hours as needed for muscle spasms or sedation.   DIGESTIVE ADVANTAGE PO Take 1 capsule by mouth daily.   DULoxetine 60 MG capsule Commonly known as:  CYMBALTA Take 1 capsule (60 mg total) by mouth daily.   fluticasone 50 MCG/ACT nasal spray Commonly known as:  FLONASE One spray in each nostril twice a day, use left hand for right nostril, and right hand for left nostril.   HYDROmorphone 2 MG tablet Commonly known as:  DILAUDID Take 1 tablet (2 mg total) by mouth every 4 (four) hours as needed for severe pain.   linaclotide 145 MCG Caps capsule Commonly known as:  LINZESS Take 1 capsule (145 mcg total) by mouth daily before breakfast.   lubiprostone 8 MCG capsule Commonly known as:  AMITIZA Take 1 capsule (8 mcg total) by mouth 2 (two) times daily with a meal.   MULTIVITAMINS Chew Chew 1 tablet by mouth daily.   naproxen sodium 220 MG tablet Commonly known as:  ALEVE Take 440 mg by mouth daily.   ondansetron 4 MG tablet Commonly known as:  ZOFRAN Take 1 tablet (4 mg total) by mouth every 8 (eight) hours as needed for nausea or vomiting.       FOLLOW UP VISIT:   Follow-up Information    Justice Britain, MD.   Specialty:  Orthopedic Surgery Why:  call to be seen in 10-14 days Contact information: [address] [place] [address] [phone]           DISCHARGE TO: Home   DISCHARGE CONDITION:  Festus Barren for Dr. Justice Britain 03/20/2017, 8:19 AM

## 2017-03-20 NOTE — Op Note (Signed)
NAME:  SHURONDA, SANTINO                     ACCOUNT NO.:  MEDICAL RECORD NO.:  [id number]  LOCATION:                                 FACILITY:  PHYSICIAN:  Metta Clines. Yeiden Frenkel, M.D.       DATE OF BIRTH:  DATE OF PROCEDURE:  03/19/2017 DATE OF DISCHARGE:                              OPERATIVE REPORT   PREOPERATIVE DIAGNOSIS:  End-stage right shoulder osteoarthritis.  POSTOPERATIVE DIAGNOSIS:  End-stage right shoulder osteoarthritis.  PROCEDURE:  A right total shoulder arthroplasty utilizing a press-fit size 6 Arthrex stem with a 42 x 17 head and a small glenoid.  SURGEON:  Metta Clines. Tamiki Kuba, MD.  Terrence DupontReather Laurence. Shuford, P.A.-C.  ANESTHESIA:  General endotracheal as well as interscalene block.  ESTIMATED BLOOD LOSS:  150 mL.  DRAINS:  None.  HISTORY:  Ms. Bezold is a 62 year old female, who has had chronic and progressive increasing right shoulder pain related to end-stage osteoarthritis.  Plain radiographs confirmed complete loss of joint space with peripheral osteophyte formation.  Due to increasing pain and functional limitations, she is brought to the operating room, at this time for planned right total shoulder arthroplasty.  Preoperatively, I counseled Ms. Montas regarding treatment options potential risks versus benefits thereof.  Possible surgical complications were all reviewed including the potential for bleeding, infection, neurovascular injury, persistent pain, loss of motion, anesthetic complication, failure of the implant, possible need for additional surgery.  She understands and accepts and agrees with our planned procedure.  PROCEDURE IN DETAIL:  After undergoing routine preop evaluation, the patient received prophylactic antibiotics.  An interscalene block was established in the holding area by the anesthesia department.  Placed supine on the operative table.  Underwent smooth induction of a general endotracheal anesthesia.  Placed in beach-chair position  and appropriately padded and protected.  Right shoulder girdle region was sterilely prepped and draped in standard fashion.  Time-out was called. Anterior deltopectoral approach to right shoulder was made through a proximal 8 cm incision.  Skin flaps were elevated, dissection carried deeply, electrocautery was used for hemostasis.  The deltopectoral interval was then developed from proximal to distal.  The vein taken laterally.  Upper centimeter of the pec major was tenotomized to improve exposure.  Conjoint tendon mobilized, retracted medially.  At this point, the long head biceps tendon was then tenodesed at the upper border of the pec major tendon.  It was then tenotomized proximally, unroofed, and excised.  We then tenotomized the subscapularis leaving an approximately 1 to 1.5 cm cuff of tissue laterally for later repair and tagged the free margin of the subscap with a series of 4 grasping #2 FiberWire sutures.  We then divided the capsular attachments from the anterior-inferior and inferior aspect of the humeral neck allowing delivery of the head through the wound.  At this point, we outlined our proposed humeral head resection with the extramedullary guide and then the humeral head resection was performed with an oscillating saw taking care to protect the rotator cuff superiorly and posteriorly.  We then moved removed large osteophytes from the anterior inferior margins of the humeral neck.  At this time, we  prepared the humeral canal, hand reaming to size 6 and then broaching to size 6 with a very tight fit which was appropriate size.  Size 5 trial with metal cap was then placed into the humeral canal and at this point, we then exposed the glenoid with combination of Fukuda, pitchfork, and snake tongue retractors.  I performed a circumferential labral resection gaining complete visualization of the margin of the glenoid and also mobilized subscapularis.  At this point, a guidepin  was then placed into the center of the glenoid.  This was reamed with the small reamer to a stable subchondral bony bed.  Placed our central drill hole, followed by the superior and inferior peg and slot respectively.  We broached the glenoid and trial showed excellent fit.  At this point, the glenoid was irrigated, cleaned, and dried.  We mixed cement, introduced this into the superior and inferior peg and slot respectively and impacted our glenoid, which achieved excellent fit and fixation.  At this point, we then returned our attention back to the humerus where the canal was cleared.  We impacted the size 6 stem to the appropriate depth, which had excellent fit and fixation.  I then tied the proximal locking screws.  We then performed a series of trial reductions and ultimately felt that the 42 x 17 eccentric head gave [place] the best soft tissue balance with good shoulder motion, good stability.  The trial was removed.  The Morse taper was cleaned and dried.  This final 42 x 17 head was then impacted into position.  Final reduction was then performed.  At this time, we confirmed that the subscapularis was properly mobilized and then repaired it back to the lesser tuberosity using #2 FiberWires in a horizontal mattress pattern.  Also placed a pair of figure-of-eight #2 FiberWire sutures through the rotator interval at the apex of the subscap to complete the repair.  At this time, the arm easily achieved 30 degrees of external rotation without excessive tension on our subscap repair and excellent shoulder motion and stability were achieved.  The wound was copiously irrigated, hemostasis was obtained.  The deltopectoral interval was then closed with a series of figure-of-eight #1 Vicryl sutures.  2-0 Monocryl used for the subcu layer, intracuticular 3-0 Monocryl for the skin, followed by Dermabond and Aquacel dressing.  Right arm was then placed in a sling.  The patient was awakened,  extubated, and taken to the recovery room in stable condition.  Jenetta Loges, P.A.-C, was used as an Environmental consultant throughout this case, essential for help with positioning the patient, positioning the extremity, tissue manipulation, suture management, implantation of prosthesis, wound closure, and intraoperative decision making.     Metta Clines. Devine Klingel, M.D.     KMS/MEDQ  D:  03/19/2017  T:  03/19/2017  Job:  [id number]

## 2017-03-20 NOTE — Progress Notes (Signed)
Discharge home. Home discharge instruction given, no question verbalized. 

## 2017-04-06 ENCOUNTER — Encounter: Payer: Self-pay | Admitting: Physician Assistant

## 2017-04-07 ENCOUNTER — Ambulatory Visit: Payer: No Typology Code available for payment source | Admitting: Physician Assistant

## 2017-04-08 ENCOUNTER — Ambulatory Visit: Payer: No Typology Code available for payment source | Admitting: Physician Assistant

## 2017-04-08 MED FILL — AMLODIPINE BESYLATE 5 MG TA: 5 | 90 days supply | Qty: 90 | Fill #0

## 2017-04-21 ENCOUNTER — Encounter: Payer: Self-pay | Admitting: Physician Assistant

## 2017-04-21 ENCOUNTER — Ambulatory Visit: Payer: No Typology Code available for payment source | Admitting: Physician Assistant

## 2017-04-21 ENCOUNTER — Ambulatory Visit (INDEPENDENT_AMBULATORY_CARE_PROVIDER_SITE_OTHER): Payer: No Typology Code available for payment source | Admitting: Physician Assistant

## 2017-04-21 VITALS — BP 125/72 | HR 89 | Temp 98.3°F | Ht 63.0 in | Wt 127.0 lb

## 2017-04-21 DIAGNOSIS — J0191 Acute recurrent sinusitis, unspecified: Secondary | ICD-10-CM

## 2017-04-21 DIAGNOSIS — I10 Essential (primary) hypertension: Secondary | ICD-10-CM | POA: Diagnosis not present

## 2017-04-21 DIAGNOSIS — Z96611 Presence of right artificial shoulder joint: Secondary | ICD-10-CM | POA: Diagnosis not present

## 2017-04-21 MED ORDER — FLUTICASONE PROPIONATE 50 MCG/ACT NA SUSP: NASAL | 3 refills | Status: AC

## 2017-04-21 MED ORDER — AMLODIPINE BESYLATE 10 MG PO TABS: 10.0000 mg | ORAL_TABLET | Freq: Every day | ORAL | 1 refills | Status: DC

## 2017-04-21 MED ORDER — AMOXICILLIN-POT CLAVULANATE 875-125 MG PO TABS: 1.0000 | ORAL_TABLET | Freq: Two times a day (BID) | ORAL | 0 refills | Status: DC

## 2017-04-21 MED FILL — FLUTICASONE PROP 50 MCG SPR: 50 | 90 days supply | Qty: 48 | Fill #0

## 2017-04-21 MED FILL — AMOX-CLAV 875-125 MG TABLET: 875-125 | 10 days supply | Qty: 20 | Fill #0

## 2017-04-21 MED FILL — AMLODIPINE BESYLATE 10 MG T: 10 | 90 days supply | Qty: 90 | Fill #0

## 2017-04-21 NOTE — Patient Instructions (Signed)

## 2017-04-21 NOTE — Progress Notes (Signed)
Subjective:    Patient ID: Jillian Stewart, female    DOB: [birth year], 62 y.o.   MRN: [id number]  HPI  Pt is a 62 yo female s/p total shoulder replacement in jan 2019 and hx of recurrent sinusitis and sinus surgery 15 years ago and HTN who presents to the clinic with sinus pressure and drainage, facial pain, nasal congestion and dry cough. Symptoms started 7  days ago. She has been taking flonase and mucinex daily. Symptoms seem to be worsening. She has tried nyquil for cough which helps but knocks her out. No fever, chills, body aches.   She is in PT and doing great with recovery of right shoulder replacement.   Recently increased norvasc to 10mg  daily due to elevated BP readings. Normal readings today. No CP, palpitations, headaches or vision changes. She is doing great. Checking BP at home and running under 130/88.  .. Active Ambulatory Problems    Diagnosis Date Noted  . Generalized anxiety disorder 03/28/2014  . Depression 03/28/2014  . Degenerative joint disease involving multiple joints 03/28/2014  . Raynaud disease 03/28/2014  . Seasonal allergies 03/28/2014  . Essential hypertension, benign 03/28/2014  . Seborrheic dermatitis 03/28/2014  . Hyperlipidemia 03/28/2014  . Degenerative joint disease of left hip 10/27/2014  . History of total left hip arthroplasty 10/27/2014  . Right shoulder pain 11/04/2014  . Acute sinus infection 02/15/2015  . GERD (gastroesophageal reflux disease) 04/11/2015  . Lung nodule seen on imaging study 02/19/2016  . S/P shoulder replacement, right 03/19/2017   Resolved Ambulatory Problems    Diagnosis Date Noted  . No Resolved Ambulatory Problems   Past Medical History:  Diagnosis Date  . Anxiety   . Arthritis   . Chronic constipation   . Colon polyps   . Complication of anesthesia   . Depression   . GERD (gastroesophageal reflux disease)   . Hyperlipidemia   . Hypertension   . PONV (postoperative nausea and vomiting)       Review of  Systems  All other systems reviewed and are negative.      Objective:   Physical Exam  Constitutional: She is oriented to person, place, and time. She appears well-developed and well-nourished.  HENT:  Head: Normocephalic and atraumatic.  Right Ear: External ear normal.  Left Ear: External ear normal.  TM"s clear bilaterally.  Tenderness over frontal, maxillary and nasal sinus bridge to palpation.  Oropharynx with PND no tonsil swelling or exudate.  Bilateral nasal turbinates red and swollen.   Eyes: Conjunctivae are normal. Right eye exhibits no discharge. Left eye exhibits no discharge.  Neck: Normal range of motion. Neck supple.  Cardiovascular: Normal rate, regular rhythm and normal heart sounds.  Pulmonary/Chest: Effort normal and breath sounds normal. She has no wheezes.  Musculoskeletal:  Right shoulder: ROM 90 degrees abduction. No pain.   Lymphadenopathy:    She has no cervical adenopathy.  Neurological: She is alert and oriented to person, place, and time.  Psychiatric: She has a normal mood and affect. Her behavior is normal.          Assessment & Plan:  Marland KitchenMarland KitchenDebra was seen today for cough and sore throat.  Diagnoses and all orders for this visit:  Acute recurrent sinusitis, unspecified location -     amoxicillin-clavulanate (AUGMENTIN) 875-125 MG tablet; Take 1 tablet by mouth 2 (two) times daily. Take 1 tab twice a day for 10 days. -     fluticasone (FLONASE) 50 MCG/ACT nasal spray; One spray in  each nostril twice a day, use left hand for right nostril, and right hand for left nostril.  Essential hypertension, benign -     amLODipine (NORVASC) 10 MG tablet; Take 1 tablet (10 mg total) by mouth daily.  S/P shoulder replacement, right   Sent refill of new BP dose. Follow up in 3 months. Elevated BP could have been due to pain as well. As pain decreases continue to watch BP.   Sent augmentin/flonase to pharmacy. Discussed other symptomatic care. HO given.  Follow up if not improving could add medrol dose pack.   Doing great after shoulder surgery. Keep up the good work with PT.

## 2017-04-28 ENCOUNTER — Encounter: Payer: Self-pay | Admitting: Physician Assistant

## 2017-04-28 MED ORDER — METHYLPREDNISOLONE 4 MG PO TBPK: ORAL_TABLET | ORAL | 0 refills | Status: DC

## 2017-04-28 MED FILL — METHYLPREDNISOLONE 4 MG TAB: 4 | 6 days supply | Qty: 21 | Fill #0

## 2017-05-05 MED FILL — DULoxetine HCL 60 MG CPEP: 60 | 90 days supply | Qty: 90 | Fill #0

## 2017-05-05 MED FILL — diazePAM 5 MG TABS: 5 | 10 days supply | Qty: 40 | Fill #1

## 2017-05-07 ENCOUNTER — Encounter: Payer: Self-pay | Admitting: Physician Assistant

## 2017-05-14 ENCOUNTER — Other Ambulatory Visit: Payer: Self-pay

## 2017-05-14 DIAGNOSIS — M19012 Primary osteoarthritis, left shoulder: Secondary | ICD-10-CM

## 2017-06-16 ENCOUNTER — Encounter: Payer: Self-pay | Admitting: Physician Assistant

## 2017-06-16 DIAGNOSIS — E782 Mixed hyperlipidemia: Secondary | ICD-10-CM

## 2017-06-16 DIAGNOSIS — Z823 Family history of stroke: Secondary | ICD-10-CM

## 2017-06-17 ENCOUNTER — Encounter: Payer: Self-pay | Admitting: Physician Assistant

## 2017-06-17 DIAGNOSIS — M25551 Pain in right hip: Secondary | ICD-10-CM

## 2017-06-17 DIAGNOSIS — M25552 Pain in left hip: Secondary | ICD-10-CM

## 2017-06-24 ENCOUNTER — Encounter (INDEPENDENT_AMBULATORY_CARE_PROVIDER_SITE_OTHER): Payer: Self-pay | Admitting: Orthopaedic Surgery

## 2017-06-24 ENCOUNTER — Ambulatory Visit (INDEPENDENT_AMBULATORY_CARE_PROVIDER_SITE_OTHER): Payer: No Typology Code available for payment source

## 2017-06-24 ENCOUNTER — Ambulatory Visit (INDEPENDENT_AMBULATORY_CARE_PROVIDER_SITE_OTHER): Payer: No Typology Code available for payment source | Admitting: Orthopaedic Surgery

## 2017-06-24 VITALS — Ht 63.0 in | Wt 127.0 lb

## 2017-06-24 DIAGNOSIS — M25552 Pain in left hip: Secondary | ICD-10-CM | POA: Insufficient documentation

## 2017-06-24 DIAGNOSIS — M25551 Pain in right hip: Secondary | ICD-10-CM

## 2017-06-24 DIAGNOSIS — Z96643 Presence of artificial hip joint, bilateral: Secondary | ICD-10-CM | POA: Insufficient documentation

## 2017-06-24 NOTE — Addendum Note (Signed)
Addended by: Marlyne Beards on: 06/24/2017 04:53 PM   Modules accepted: Orders

## 2017-06-24 NOTE — Progress Notes (Signed)
Office Visit Note   Patient: Jillian Stewart           Date of Birth: [birth year]           MRN: [id number] Visit Date: 06/24/2017              Requested by: Donella Stade, PA-C [place], [place] [address] PCP: Donella Stade, PA-C   Assessment & Plan: Visit Diagnoses:  1. Pain in right hip   2. Pain in left hip   3. History of bilateral hip replacements     Plan: Given the groin pain on both hips bilaterally and the fact that these are metal-on metal articulating surfaces there is a risk of pseudotumor and metallosis.  A MARS MRI is warranted of both hips to rule out pseudotumor or other complicating features with these hips given the length of time they have been in place.  We will see her back after the MRI.  Follow-Up Instructions: Return in about 2 weeks (around 07/08/2017).   Orders:  Orders Placed This Encounter  Procedures  . XR HIP UNILAT W OR W/O PELVIS 2-3 VIEWS RIGHT  . XR HIP UNILAT W OR W/O PELVIS 2-3 VIEWS LEFT   No orders of the defined types were placed in this encounter.     Procedures: No procedures performed   Clinical Data: No additional findings.   Subjective: Chief Complaint  Patient presents with  . Right Hip - Pain    S/p THA 2009  . Left Hip - Pain    S/p THA 2007  The patient comes in with a chief complaint of bilateral hip pain that is in the groin with the right worse than left.  This is been a dull aching pain is been slow to come on for the last several months.  She has a history of bilateral hip resurfacing arthroplasties that was done in [place].  The left was done in 2007 and the right one in 2009.  She denies any other specific pain other than some trochanteric pain and thigh pain on the right side and on the left.  She denies any specific injuries.  She is tried anti-inflammatories and activity modification as well but she still gets as dull aching pain.  HPI  Review of Systems She  currently denies any headache, chest pain, shortness of breath, fever, chills, nausea, vomiting.  Objective: Vital Signs: Ht 5\' 3"  (1.6 m)   Wt 127 lb (57.6 kg)   BMI 22.50 kg/m   Physical Exam She is alert and oriented x3 and in no acute distress Ortho Exam Examination of both hips show fluid and full range of motion with all equal leg lengths.  She has normal knee exam bilaterally.  She does have some pain in the groin there is mild with extremes of rotation. Specialty Comments:  No specialty comments available.  Imaging: Xr Hip Unilat W Or W/o Pelvis 2-3 Views Left  Result Date: 06/24/2017 An AP pelvis and lateral left hip shows a hip resurfacing arthroplasty with no evidence of failure or acute findings.  Xr Hip Unilat W Or W/o Pelvis 2-3 Views Right  Result Date: 06/24/2017 An AP pelvis and lateral of the right hip shows a hip resurfacing arthroplasty with no complicating features or acute findings.    PMFS History: Patient Active Problem List   Diagnosis Date Noted  . History of bilateral hip replacements 06/24/2017  . Pain in left hip  06/24/2017  . Pain in right hip 06/24/2017  . S/P shoulder replacement, right 03/19/2017  . Lung nodule seen on imaging study 02/19/2016  . GERD (gastroesophageal reflux disease) 04/11/2015  . Acute sinus infection 02/15/2015  . Right shoulder pain 11/04/2014  . Degenerative joint disease of left hip 10/27/2014  . History of total left hip arthroplasty 10/27/2014  . Generalized anxiety disorder 03/28/2014  . Depression 03/28/2014  . Degenerative joint disease involving multiple joints 03/28/2014  . Raynaud disease 03/28/2014  . Seasonal allergies 03/28/2014  . Essential hypertension, benign 03/28/2014  . Seborrheic dermatitis 03/28/2014  . Hyperlipidemia 03/28/2014   Past Medical History:  Diagnosis Date  . Anxiety   . Arthritis   . Chronic constipation   . Colon polyps   . Complication of anesthesia   . Depression   . GERD  (gastroesophageal reflux disease)   . Hyperlipidemia   . Hypertension   . PONV (postoperative nausea and vomiting)     Family History  Problem Relation Age of Onset  . Cancer Mother        cholangiocarcinoma  . Stroke Father   . Diabetes Maternal Grandmother     Past Surgical History:  Procedure Laterality Date  . BREAST SURGERY Right   . HIP SURGERY     right and left.    . OVARIAN CYST SURGERY Left   . RHINOPLASTY    . TOTAL SHOULDER ARTHROPLASTY Right 03/19/2017   Procedure: TOTAL SHOULDER ARTHROPLASTY;  Surgeon: Justice Britain, MD;  Location: [facility];  Service: Orthopedics;  Laterality: Right;   Social History   Occupational History  . Occupation: retired  Tobacco Use  . Smoking status: Former Smoker    Types: Cigarettes    Last attempt to quit: 03/13/2003    Years since quitting: 14.2  . Smokeless tobacco: Never Used  Substance and Sexual Activity  . Alcohol use: Yes    Alcohol/week: 0.6 - 1.2 oz    Types: 1 - 2 Cans of beer per week  . Drug use: No  . Sexual activity: Yes    Birth control/protection: Post-menopausal

## 2017-06-29 ENCOUNTER — Encounter (INDEPENDENT_AMBULATORY_CARE_PROVIDER_SITE_OTHER): Payer: Self-pay | Admitting: Orthopaedic Surgery

## 2017-07-06 ENCOUNTER — Encounter: Payer: Self-pay | Admitting: Physician Assistant

## 2017-07-07 ENCOUNTER — Encounter: Payer: Self-pay | Admitting: Physician Assistant

## 2017-07-08 MED ORDER — LINACLOTIDE 290 MCG PO CAPS: 290.0000 ug | ORAL_CAPSULE | Freq: Every day | ORAL | 0 refills | Status: DC

## 2017-07-08 MED FILL — LINZESS 290 MCG CAPSULE: 290 | 90 days supply | Qty: 90 | Fill #0

## 2017-07-09 ENCOUNTER — Other Ambulatory Visit: Payer: No Typology Code available for payment source

## 2017-07-09 ENCOUNTER — Ambulatory Visit (INDEPENDENT_AMBULATORY_CARE_PROVIDER_SITE_OTHER): Payer: No Typology Code available for payment source | Admitting: Orthopaedic Surgery

## 2017-07-13 ENCOUNTER — Ambulatory Visit
Admission: RE | Admit: 2017-07-13 | Discharge: 2017-07-13 | Disposition: A | Discharge status: Home / Self Care | Payer: No Typology Code available for payment source | Source: Ambulatory Visit | Attending: Orthopaedic Surgery | Admitting: Orthopaedic Surgery

## 2017-07-13 DIAGNOSIS — Z96643 Presence of artificial hip joint, bilateral: Secondary | ICD-10-CM

## 2017-07-21 ENCOUNTER — Encounter (HOSPITAL_BASED_OUTPATIENT_CLINIC_OR_DEPARTMENT_OTHER): Payer: No Typology Code available for payment source

## 2017-07-21 ENCOUNTER — Ambulatory Visit (HOSPITAL_BASED_OUTPATIENT_CLINIC_OR_DEPARTMENT_OTHER)
Admission: RE | Admit: 2017-07-21 | Discharge: 2017-07-21 | Disposition: A | Discharge status: Home / Self Care | Payer: No Typology Code available for payment source | Source: Ambulatory Visit | Attending: Physician Assistant | Admitting: Physician Assistant

## 2017-07-21 DIAGNOSIS — E785 Hyperlipidemia, unspecified: Secondary | ICD-10-CM | POA: Insufficient documentation

## 2017-07-21 DIAGNOSIS — E782 Mixed hyperlipidemia: Secondary | ICD-10-CM | POA: Diagnosis not present

## 2017-07-21 DIAGNOSIS — Z823 Family history of stroke: Secondary | ICD-10-CM | POA: Insufficient documentation

## 2017-07-21 NOTE — Progress Notes (Signed)
Call pt: normal carotid arteries.

## 2017-07-21 NOTE — Progress Notes (Signed)
  Complete carotid duplex performed.  Joelene Millin 07/21/2017, 3:49 PM

## 2017-07-23 ENCOUNTER — Ambulatory Visit (INDEPENDENT_AMBULATORY_CARE_PROVIDER_SITE_OTHER): Payer: No Typology Code available for payment source | Admitting: Orthopaedic Surgery

## 2017-07-23 ENCOUNTER — Encounter (INDEPENDENT_AMBULATORY_CARE_PROVIDER_SITE_OTHER): Payer: Self-pay | Admitting: Orthopaedic Surgery

## 2017-07-23 DIAGNOSIS — Z96643 Presence of artificial hip joint, bilateral: Secondary | ICD-10-CM

## 2017-07-23 NOTE — Progress Notes (Signed)
Patient is following up after having MRIs of both her hips.  She has bilateral hip resurfacing arthroplasties that were done elsewhere.  One hip was done 8 years ago and the other 10 years ago.  [place] state.  She is been having a little bit of pain in the right groin and is concerned about the general.  They are metal-on-metal articulating bearing surfaces.  The pain is only minimal.  Plain films did not show any worrisome features.  I can still put both hips easily through full range of motion with no difficulty at all.  Her incisions have healed nicely.  An MRI of both hips are obtained and showed no worrisome features in terms of pseudotumor or fluid collections around either hip or evidence of hardware or implant failure.  This point she will follow-up as needed.  I do not see any reason to consider other revision surgery for her hips based on the plain films and MRI findings combined with her clinical exam.  All questions concerns were answered and addressed.  Follow-up will be as needed.

## 2017-07-27 ENCOUNTER — Other Ambulatory Visit: Payer: Self-pay | Admitting: Physician Assistant

## 2017-07-27 ENCOUNTER — Ambulatory Visit (INDEPENDENT_AMBULATORY_CARE_PROVIDER_SITE_OTHER): Payer: No Typology Code available for payment source | Admitting: Psychiatry

## 2017-07-27 ENCOUNTER — Encounter (HOSPITAL_COMMUNITY): Payer: Self-pay | Admitting: Psychiatry

## 2017-07-27 VITALS — BP 126/82 | HR 82 | Ht 64.0 in | Wt 130.0 lb

## 2017-07-27 DIAGNOSIS — Z915 Personal history of self-harm: Secondary | ICD-10-CM | POA: Diagnosis not present

## 2017-07-27 DIAGNOSIS — F4312 Post-traumatic stress disorder, chronic: Secondary | ICD-10-CM | POA: Diagnosis not present

## 2017-07-27 DIAGNOSIS — Z62810 Personal history of physical and sexual abuse in childhood: Secondary | ICD-10-CM

## 2017-07-27 DIAGNOSIS — G8929 Other chronic pain: Secondary | ICD-10-CM | POA: Diagnosis not present

## 2017-07-27 DIAGNOSIS — F411 Generalized anxiety disorder: Secondary | ICD-10-CM | POA: Diagnosis not present

## 2017-07-27 DIAGNOSIS — G4700 Insomnia, unspecified: Secondary | ICD-10-CM | POA: Diagnosis not present

## 2017-07-27 DIAGNOSIS — Z62811 Personal history of psychological abuse in childhood: Secondary | ICD-10-CM | POA: Diagnosis not present

## 2017-07-27 DIAGNOSIS — Z79899 Other long term (current) drug therapy: Secondary | ICD-10-CM

## 2017-07-27 DIAGNOSIS — R4581 Low self-esteem: Secondary | ICD-10-CM | POA: Diagnosis not present

## 2017-07-27 DIAGNOSIS — Z62898 Other specified problems related to upbringing: Secondary | ICD-10-CM | POA: Diagnosis not present

## 2017-07-27 DIAGNOSIS — F330 Major depressive disorder, recurrent, mild: Secondary | ICD-10-CM

## 2017-07-27 DIAGNOSIS — F331 Major depressive disorder, recurrent, moderate: Secondary | ICD-10-CM

## 2017-07-27 DIAGNOSIS — Z87891 Personal history of nicotine dependence: Secondary | ICD-10-CM | POA: Diagnosis not present

## 2017-07-27 MED ORDER — GABAPENTIN 300 MG PO CAPS: 300.0000 mg | ORAL_CAPSULE | Freq: Every day | ORAL | 0 refills | Status: DC

## 2017-07-27 MED ORDER — DULOXETINE HCL 60 MG PO CPEP: 60.0000 mg | ORAL_CAPSULE | Freq: Two times a day (BID) | ORAL | 0 refills | Status: DC

## 2017-07-27 MED FILL — GABAPENTIN 300 MG CAPSULE: 300 | 90 days supply | Qty: 90 | Fill #0

## 2017-07-27 MED FILL — DULoxetine HCL 60 MG CPEP: 60 | 90 days supply | Qty: 180 | Fill #0
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
## 2017-07-28 ENCOUNTER — Encounter: Payer: Self-pay | Admitting: Physician Assistant

## 2017-07-28 ENCOUNTER — Ambulatory Visit (INDEPENDENT_AMBULATORY_CARE_PROVIDER_SITE_OTHER): Payer: No Typology Code available for payment source | Admitting: Physician Assistant

## 2017-07-28 VITALS — BP 153/94 | HR 90 | Temp 98.3°F | Ht 63.0 in | Wt 129.0 lb

## 2017-07-28 DIAGNOSIS — H1033 Unspecified acute conjunctivitis, bilateral: Secondary | ICD-10-CM

## 2017-07-28 MED ORDER — POLYMYXIN B-TRIMETHOPRIM 10000-0.1 UNIT/ML-% OP SOLN: 1.0000 [drp] | OPHTHALMIC | 0 refills | Status: AC

## 2017-07-28 MED ORDER — ERYTHROMYCIN 5 MG/GM OP OINT: 1.0000 "application " | TOPICAL_OINTMENT | Freq: Every day | OPHTHALMIC | 0 refills | Status: AC

## 2017-07-28 MED FILL — ERYTHROMYCIN EYE OINTMENT: 5 | 20 days supply | Qty: 4 | Fill #0

## 2017-07-28 MED FILL — POLYMYXIN B/TMP EYE DROPS: 10000-0.1 | 17 days supply | Qty: 10 | Fill #0

## 2017-07-28 NOTE — Progress Notes (Signed)
HPI:                                                                Jillian Stewart is a 62 y.o. female who presents to [facility] today for conjunctivitis  Conjunctivitis   The current episode started yesterday. The onset was sudden. The symptoms are relieved by one or more OTC medications (OTC allergy drops). Associated symptoms include decreased vision, eye itching, eye discharge (debris stuck to lower lashes) and eye redness. Pertinent negatives include no fever, no double vision, no photophobia, no congestion, no ear pain, no rhinorrhea, no sore throat and no eye pain.      No flowsheet data found.    Past Medical History:  Diagnosis Date  . Anxiety   . Arthritis   . Chronic constipation   . Colon polyps   . Complication of anesthesia   . Depression   . GERD (gastroesophageal reflux disease)   . Hyperlipidemia   . Hypertension   . PONV (postoperative nausea and vomiting)    Past Surgical History:  Procedure Laterality Date  . BREAST SURGERY Right   . HIP SURGERY     right and left.    . OVARIAN CYST SURGERY Left   . RHINOPLASTY    . TOTAL SHOULDER ARTHROPLASTY Right 03/19/2017   Procedure: TOTAL SHOULDER ARTHROPLASTY;  Surgeon: Justice Britain, MD;  Location: [facility];  Service: Orthopedics;  Laterality: Right;   Social History   Tobacco Use  . Smoking status: Former Smoker    Types: Cigarettes    Last attempt to quit: 03/13/2003    Years since quitting: 14.3  . Smokeless tobacco: Never Used  Substance Use Topics  . Alcohol use: Yes    Alcohol/week: 0.6 - 1.2 oz    Types: 1 - 2 Cans of beer per week   family history includes Cancer in her mother; Diabetes in her maternal grandmother; Stroke in her father.    ROS: negative except as noted in the HPI  Medications: Current Outpatient Medications  Medication Sig Dispense Refill  . acetaminophen (TYLENOL) 500 MG tablet Take 1,000 mg by mouth every 6 (six) hours as  needed for moderate pain.    Marland Kitchen amLODipine (NORVASC) 10 MG tablet Take 1 tablet (10 mg total) by mouth daily. 90 tablet 1  . amoxicillin-clavulanate (AUGMENTIN) 875-125 MG tablet Take 1 tablet by mouth 2 (two) times daily. Take 1 tab twice a day for 10 days. 20 tablet 0  . aspirin 81 MG chewable tablet Chew 81 mg by mouth daily.    Marland Kitchen BIOTIN PO Take 1 tablet by mouth daily.    . Calcium-Magnesium-Vitamin D (CALCIUM 500 PO) Take 500 mg by mouth daily.    . cholecalciferol (VITAMIN D) 1000 units tablet Take 1,000 Units by mouth daily.    . clotrimazole-betamethasone (LOTRISONE) cream Apply 1 application topically 2 (two) times daily. 30 g 0  . diazepam (VALIUM) 5 MG tablet Take 0.5-1 tablets (2.5-5 mg total) by mouth every 6 (six) hours as needed for muscle spasms or sedation. 40 tablet 1  . DULoxetine (CYMBALTA) 60 MG capsule Take 1 capsule (60 mg total) by mouth 2 (two) times daily. 180 capsule 0  . fluticasone (FLONASE) 50 MCG/ACT nasal  spray One spray in each nostril twice a day, use left hand for right nostril, and right hand for left nostril. 48 g 3  . gabapentin (NEURONTIN) 300 MG capsule Take 1 capsule (300 mg total) by mouth at bedtime. 90 capsule 0  . HYDROmorphone (DILAUDID) 2 MG tablet Take 1 tablet (2 mg total) by mouth every 4 (four) hours as needed for severe pain. 40 tablet 0  . linaclotide (LINZESS) 290 MCG CAPS capsule Take 1 capsule (290 mcg total) by mouth daily. 90 capsule 0  . lubiprostone (AMITIZA) 8 MCG capsule Take 1 capsule (8 mcg total) by mouth 2 (two) times daily with a meal. 60 capsule 2  . methylPREDNISolone (MEDROL DOSEPAK) 4 MG TBPK tablet Take as directed by package insert. 21 tablet 0  . Multiple Vitamins-Minerals (MULTIVITAMINS) CHEW Chew 1 tablet by mouth daily.    . naproxen sodium (ALEVE) 220 MG tablet Take 440 mg by mouth daily.    . ondansetron (ZOFRAN) 4 MG tablet Take 1 tablet (4 mg total) by mouth every 8 (eight) hours as needed for nausea or vomiting. 20  tablet 0  . Probiotic Product (DIGESTIVE ADVANTAGE PO) Take 1 capsule by mouth daily.     No current facility-administered medications for this visit.    Allergies  Allergen Reactions  . Other Other (See Comments)    Anesthesia - sick for 3 days   . Statins Other (See Comments)    Severe muscle cramps. Pravastatin, Simvastatin, Atorvastatin, & Livalo  . Welchol [Colesevelam] Other (See Comments)    GI issues.   Chauncey Cruel [Ezetimibe] Other (See Comments)    Muscle cramps       Objective:  BP (!) 153/94   Pulse 90   Temp 98.3 F (36.8 C) (Oral)   Ht 5\' 3"  (1.6 m)   Wt 129 lb (58.5 kg)   BMI 22.85 kg/m  Gen:  alert, not ill-appearing, no distress, appropriate for age HEENT: head normocephalic without obvious abnormality, mild bilateral scleral injection, purulent debris of the left lower eyelid, no eyelid edema or erythema, PERRL, EOM's intact, trachea midline Pulm: Normal work of breathing, normal phonation Neuro: alert and oriented x 3 Skin: intact, no rashes on exposed skin, no jaundice, no cyanosis     No results found for this or any previous visit (from the past 72 hour(s)). No results found.    Assessment and Plan: 62 y.o. female with   Acute conjunctivitis of both eyes, unspecified acute conjunctivitis type - Plan: trimethoprim-polymyxin b (POLYTRIM) ophthalmic solution, erythromycin (ROMYCIN) ophthalmic ointment - bacterial versus viral conjunctivitis - no vision change, fb sensation, or eye pain. Patient did not have her glasses with her today to check visual acuity - treating empirically with Polytrim during the day and Erythromycin at bedtime     Patient education and anticipatory guidance given Patient agrees with treatment plan Follow-up as needed if symptoms worsen or fail to improve  Darlyne Russian PA-C

## 2017-07-28 NOTE — Patient Instructions (Addendum)
- bacterial conjunctivitis should resolve within 48-72 hours with treatment - use antibiotic drops every 3 hours during the day for 7 days - use ointment at bedtime for 7 days - if no improvement within 72 hours, this is likely viral conjunctivitis and will resolve on its own within 2 weeks. You can use OTC Zatidor eye drops as needed for itching/discomfort   Bacterial Conjunctivitis Bacterial conjunctivitis is an infection of the clear membrane that covers the white part of your eye and the inner surface of your eyelid (conjunctiva). When the blood vessels in your conjunctiva become inflamed, your eye becomes red or pink, and it will probably feel itchy. Bacterial conjunctivitis spreads very easily from person to person (is contagious). It also spreads easily from one eye to the other eye. What are the causes? This condition is caused by several common bacteria. You may get the infection if you come into close contact with another person who is infected. You may also come into contact with items that are contaminated with the bacteria, such as a face towel, contact lens solution, or eye makeup. What increases the risk? This condition is more likely to develop in people who:  Are exposed to other people who have the infection.  Wear contact lenses.  Have a sinus infection.  Have had a recent eye injury or surgery.  Have a weak body defense system (immune system).  Have a medical condition that causes dry eyes.  What are the signs or symptoms? Symptoms of this condition include:  Eye redness.  Tearing or watery eyes.  Itchy eyes.  Burning feeling in your eyes.  Thick, yellowish discharge from an eye. This may turn into a crust on the eyelid overnight and cause your eyelids to stick together.  Swollen eyelids.  Blurred vision.  How is this diagnosed? Your health care provider can diagnose this condition based on your symptoms and medical history. Your health care provider may  also take a sample of discharge from your eye to find the cause of your infection. This is rarely done. How is this treated? Treatment for this condition includes:  Antibiotic eye drops or ointment to clear the infection more quickly and prevent the spread of infection to others.  Oral antibiotic medicines to treat infections that do not respond to drops or ointments, or last longer than 10 days.  Cool, wet cloths (cool compresses) placed on the eyes.  Artificial tears applied 2-6 times a day.  Follow these instructions at home: Medicines  Take or apply your antibiotic medicine as told by your health care provider. Do not stop taking or applying the antibiotic even if you start to feel better.  Take or apply over-the-counter and prescription medicines only as told by your health care provider.  Be very careful to avoid touching the edge of your eyelid with the eye drop bottle or the ointment tube when you apply medicines to the affected eye. This will keep you from spreading the infection to your other eye or to other people. Managing discomfort  Gently wipe away any drainage from your eye with a warm, wet washcloth or a cotton ball.  Apply a cool, clean washcloth to your eye for 10-20 minutes, 3-4 times a day. General instructions  Do not wear contact lenses until the inflammation is gone and your health care provider says it is safe to wear them again. Ask your health care provider how to sterilize or replace your contact lenses before you use them again. Wear  glasses until you can resume wearing contacts.  Avoid wearing eye makeup until the inflammation is gone. Throw away any old eye cosmetics that may be contaminated.  Change or wash your pillowcase every day.  Do not share towels or washcloths. This may spread the infection.  Wash your hands often with soap and water. Use paper towels to dry your hands.  Avoid touching or rubbing your eyes.  Do not drive or use heavy  machinery if your vision is blurred. Contact a health care provider if:  You have a fever.  Your symptoms do not get better after 10 days. Get help right away if:  You have a fever and your symptoms suddenly get worse.  You have severe pain when you move your eye.  You have facial pain, redness, or swelling.  You have sudden loss of vision. This information is not intended to replace advice given to you by your health care provider. Make sure you discuss any questions you have with your health care provider. Document Released: 02/24/2005 Document Revised: 07/05/2015 Document Reviewed: 12/07/2014 Elsevier Interactive Patient Education  2017 [employer].

## 2017-07-29 ENCOUNTER — Encounter (HOSPITAL_COMMUNITY): Payer: Self-pay | Admitting: Psychiatry

## 2017-07-30 ENCOUNTER — Encounter: Payer: Self-pay | Admitting: Physician Assistant

## 2017-07-30 DIAGNOSIS — Z1211 Encounter for screening for malignant neoplasm of colon: Secondary | ICD-10-CM

## 2017-08-04 ENCOUNTER — Telehealth: Payer: Self-pay

## 2017-08-04 ENCOUNTER — Telehealth: Payer: Self-pay | Admitting: Gastroenterology

## 2017-08-04 NOTE — Telephone Encounter (Signed)
Left message for patient to call back. Looking at her colonoscopy report from 08/26/2011, that physician recommended she have a colonoscopy in 5 years. I will send this to Dr. Havery Moros for his review, I have asked patient to call back to let her know that Dr. Havery Moros is out of the office this week and it will be next week before I can get an answer for her about scheduling.

## 2017-08-04 NOTE — Telephone Encounter (Signed)
Spoke to patient, relayed information about old records and Dr. Havery Moros will review. Once he has our office will contact her back and schedule appropriately.

## 2017-08-06 ENCOUNTER — Encounter: Payer: Self-pay | Admitting: Gastroenterology

## 2017-08-06 NOTE — Telephone Encounter (Signed)
Colonoscopy scheduled.

## 2017-08-06 NOTE — Telephone Encounter (Signed)
Please contact patient to schedule colonoscopy at [facility] and pre-visit appointment please. See Dr. Doyne Keel note below.

## 2017-08-06 NOTE — Telephone Encounter (Signed)
Thanks Almyra Free. I reviewed my prior office note. I don't have full records of all of her prior colonoscopies but she was anxious about this and wanted to continue surveillance exams every 5 years (she reportedly has a history of a high risk polyp). Okay to proceed with scheduling a colonoscopy for her. Thanks

## 2017-08-11 MED FILL — AMOXICILLIN 500 MG CAPSULE: 500 | 5 days supply | Qty: 20 | Fill #0

## 2017-08-20 ENCOUNTER — Encounter: Payer: Self-pay | Admitting: Physician Assistant

## 2017-09-07 ENCOUNTER — Encounter: Payer: Self-pay | Admitting: Physician Assistant

## 2017-09-07 MED FILL — AMLODIPINE BESYLATE 10 MG T: 10 | 90 days supply | Qty: 90 | Fill #1

## 2017-09-08 ENCOUNTER — Encounter (HOSPITAL_COMMUNITY): Payer: Self-pay | Admitting: Psychiatry

## 2017-09-15 ENCOUNTER — Ambulatory Visit: Payer: No Typology Code available for payment source | Admitting: Psychology

## 2017-09-15 DIAGNOSIS — F331 Major depressive disorder, recurrent, moderate: Secondary | ICD-10-CM | POA: Diagnosis not present

## 2017-09-24 ENCOUNTER — Other Ambulatory Visit: Payer: Self-pay

## 2017-09-24 ENCOUNTER — Ambulatory Visit (AMBULATORY_SURGERY_CENTER): Payer: Self-pay

## 2017-09-24 VITALS — Ht 63.0 in | Wt 130.2 lb

## 2017-09-24 DIAGNOSIS — Z86010 Personal history of colonic polyps: Secondary | ICD-10-CM

## 2017-09-24 MED ORDER — NA SULFATE-K SULFATE-MG SULF 17.5-3.13-1.6 GM/177ML PO SOLN: 1.0000 | Freq: Once | ORAL | 0 refills | Status: AC

## 2017-09-24 MED FILL — SUPREP BOWEL PREP KIT: 17.5-3.13-1 | 1 days supply | Qty: 354 | Fill #0

## 2017-09-24 NOTE — Progress Notes (Signed)
No egg or soy allergy known to patient  No issues with past sedation with any surgeries  or procedures, no intubation problems , problems when she had her hip surgery, complaining of vomiting after surgery. Denies having  problems after colonoscopy. No diet pills per patient No home 02 use per patient  No blood thinners per patient  Pt denies issues with constipation, has some problems with constipation.  No A fib or A flutter  EMMI video sent to pt's e mail

## 2017-09-28 ENCOUNTER — Telehealth: Payer: No Typology Code available for payment source | Admitting: Family

## 2017-09-28 DIAGNOSIS — N390 Urinary tract infection, site not specified: Secondary | ICD-10-CM

## 2017-09-28 MED ORDER — CEPHALEXIN 500 MG PO CAPS: 500.0000 mg | ORAL_CAPSULE | Freq: Two times a day (BID) | ORAL | 0 refills | Status: DC

## 2017-09-28 MED FILL — CEPHALEXIN 500 MG CAPSULE: 500 | 7 days supply | Qty: 14 | Fill #0

## 2017-09-28 NOTE — Progress Notes (Signed)

## 2017-09-29 ENCOUNTER — Ambulatory Visit: Payer: No Typology Code available for payment source | Admitting: Psychology

## 2017-09-29 DIAGNOSIS — F331 Major depressive disorder, recurrent, moderate: Secondary | ICD-10-CM

## 2017-09-30 ENCOUNTER — Telehealth: Payer: Self-pay | Admitting: Gastroenterology

## 2017-09-30 NOTE — Telephone Encounter (Signed)
Called mobile that states this is " ANG"- Lm we are trying to call number left and line has been busy for hours now

## 2017-09-30 NOTE — Telephone Encounter (Signed)
241 pm line is still busy for pt. Lenard Galloway RN

## 2017-09-30 NOTE — Telephone Encounter (Signed)
Attempted pt again at [phone]- line is still busy

## 2017-09-30 NOTE — Telephone Encounter (Signed)
1703 pm line busy still.

## 2017-09-30 NOTE — Telephone Encounter (Signed)
Numerous attempts were made  to return patients call regarding a 2 day prep. Unable to reach the patient. Line was always busy. Will continue to try to call.   Riki Sheer, LPN

## 2017-10-01 NOTE — Telephone Encounter (Signed)
Pt is calling back about her prep.  She will be available around 4:30 today #[phone] or [phone]

## 2017-10-01 NOTE — Telephone Encounter (Signed)
#  [phone] or [phone]

## 2017-10-01 NOTE — Telephone Encounter (Signed)
I finally spoke with the patient. She explained that she is taking Miralax daily and linzess and is having a BM every day. Pt does not think she needs the 2 day prep. I explained to her that I cannot give her permission not to do the instructions, and explained to her that if she is not cleaned out she will need to repeat the colonoscopy. Pt verbalizes understanding.

## 2017-10-06 ENCOUNTER — Ambulatory Visit (HOSPITAL_COMMUNITY): Payer: Self-pay | Admitting: Psychiatry

## 2017-10-13 ENCOUNTER — Ambulatory Visit: Payer: No Typology Code available for payment source | Admitting: Psychology

## 2017-10-13 DIAGNOSIS — F331 Major depressive disorder, recurrent, moderate: Secondary | ICD-10-CM

## 2017-10-16 ENCOUNTER — Encounter: Payer: Self-pay | Admitting: Gastroenterology

## 2017-10-16 ENCOUNTER — Ambulatory Visit (AMBULATORY_SURGERY_CENTER): Payer: No Typology Code available for payment source | Admitting: Gastroenterology

## 2017-10-16 VITALS — BP 122/77 | HR 63 | Temp 96.9°F | Resp 13 | Ht 63.0 in | Wt 130.0 lb

## 2017-10-16 DIAGNOSIS — Z86010 Personal history of colonic polyps: Secondary | ICD-10-CM | POA: Diagnosis not present

## 2017-10-16 DIAGNOSIS — D123 Benign neoplasm of transverse colon: Secondary | ICD-10-CM | POA: Diagnosis not present

## 2017-10-16 DIAGNOSIS — D124 Benign neoplasm of descending colon: Secondary | ICD-10-CM

## 2017-10-16 MED ORDER — SODIUM CHLORIDE 0.9 % IV SOLN
500.0000 mL | Freq: Once | INTRAVENOUS | Status: DC
Start: 2017-10-16 — End: 2017-10-16

## 2017-10-16 NOTE — Op Note (Signed)
[facility] Patient Name: Jillian Stewart Procedure Date: 10/16/2017 11:47 AM MRN: [id number] Endoscopist: Remo Lipps P. Havery Moros , MD Age: 62 Referring MD:  Date of Birth: Jul 18, 1955 Gender: Female Account #: [id number] Procedure:                Colonoscopy Indications:              High risk colon cancer surveillance: Personal                            history of colonic polyps Medicines:                Monitored Anesthesia Care Procedure:                Pre-Anesthesia Assessment:                           - Prior to the procedure, a History and Physical                            was performed, and patient medications and                            allergies were reviewed. The patient's tolerance of                            previous anesthesia was also reviewed. The risks                            and benefits of the procedure and the sedation                            options and risks were discussed with the patient.                            All questions were answered, and informed consent                            was obtained. Prior Anticoagulants: The patient has                            taken no previous anticoagulant or antiplatelet                            agents. ASA Grade Assessment: II - A patient with                            mild systemic disease. After reviewing the risks                            and benefits, the patient was deemed in                            satisfactory condition to undergo the procedure.  After obtaining informed consent, the colonoscope                            was passed under direct vision. Throughout the                            procedure, the patient's blood pressure, pulse, and                            oxygen saturations were monitored continuously. The                            Colonoscope was introduced through the anus and                            advanced to the the cecum,  identified by                            appendiceal orifice and ileocecal valve. The                            colonoscopy was performed without difficulty. The                            patient tolerated the procedure well. The quality                            of the bowel preparation was adequate. The                            ileocecal valve, appendiceal orifice, and rectum                            were photographed. Scope In: 11:52:21 AM Scope Out: 12:14:01 PM Scope Withdrawal Time: 0 hours 15 minutes 30 seconds  Total Procedure Duration: 0 hours 21 minutes 40 seconds  Findings:                 The perianal and digital rectal examinations were                            normal.                           A diminutive polyp was found in the hepatic                            flexure. The polyp was sessile. The polyp was                            removed with a cold biopsy forceps. Resection and                            retrieval were complete.  A diminutive polyp was found in the transverse                            colon. The polyp was sessile. The polyp was removed                            with a cold biopsy forceps. Resection and retrieval                            were complete.                           A diminutive polyp was found in the descending                            colon. The polyp was sessile. The polyp was removed                            with a cold biopsy forceps. Resection and retrieval                            were complete.                           A few small-mouthed diverticula were found in the                            sigmoid colon.                           The colon was tortuous.                           Internal hemorrhoids were found during                            retroflexion. The hemorrhoids were small.                           The exam was otherwise without abnormality. Complications:            No  immediate complications. Estimated blood loss:                            Minimal. Estimated Blood Loss:     Estimated blood loss was minimal. Impression:               - One diminutive polyp at the hepatic flexure,                            removed with a cold biopsy forceps. Resected and                            retrieved.                           -  One diminutive polyp in the transverse colon,                            removed with a cold biopsy forceps. Resected and                            retrieved.                           - One diminutive polyp in the descending colon,                            removed with a cold biopsy forceps. Resected and                            retrieved.                           - Diverticulosis in the sigmoid colon.                           - Tortuous colon.                           - Internal hemorrhoids.                           - The examination was otherwise normal. Recommendation:           - Patient has a contact number available for                            emergencies. The signs and symptoms of potential                            delayed complications were discussed with the                            patient. Return to normal activities tomorrow.                            Written discharge instructions were provided to the                            patient.                           - Resume previous diet.                           - Continue present medications.                           - Await pathology results.                           - Repeat colonoscopy for surveillance based on  pathology results. Remo Lipps P. Armbruster, MD 10/16/2017 12:19:15 PM This report has been signed electronically.

## 2017-10-16 NOTE — Progress Notes (Signed)
Pt's states no medical or surgical changes since previsit or office visit. 

## 2017-10-16 NOTE — Patient Instructions (Signed)
HANDOUT GIVEN FOR POLYPS AND HEMORRHOID BANDING  YOU HAD AN ENDOSCOPIC PROCEDURE TODAY AT [facility]:   Refer to the procedure report that was given to you for any specific questions about what was found during the examination.  If the procedure report does not answer your questions, please call your gastroenterologist to clarify.  If you requested that your care partner not be given the details of your procedure findings, then the procedure report has been included in a sealed envelope for you to review at your convenience later.  YOU SHOULD EXPECT: Some feelings of bloating in the abdomen. Passage of more gas than usual.  Walking can help get rid of the air that was put into your GI tract during the procedure and reduce the bloating. If you had a lower endoscopy (such as a colonoscopy or flexible sigmoidoscopy) you may notice spotting of blood in your stool or on the toilet paper. If you underwent a bowel prep for your procedure, you may not have a normal bowel movement for a few days.  Please Note:  You might notice some irritation and congestion in your nose or some drainage.  This is from the oxygen used during your procedure.  There is no need for concern and it should clear up in a day or so.  SYMPTOMS TO REPORT IMMEDIATELY:   Following lower endoscopy (colonoscopy or flexible sigmoidoscopy):  Excessive amounts of blood in the stool  Significant tenderness or worsening of abdominal pains  Swelling of the abdomen that is new, acute  Fever of 100F or higher   For urgent or emergent issues, a gastroenterologist can be reached at any hour by calling [phone].   DIET:  We do recommend a small meal at first, but then you may proceed to your regular diet.  Drink plenty of fluids but you should avoid alcoholic beverages for 24 hours.  ACTIVITY:  You should plan to take it easy for the rest of today and you should NOT DRIVE or use heavy machinery until tomorrow (because  of the sedation medicines used during the test).    FOLLOW UP: Our staff will call the number listed on your records the next business day following your procedure to check on you and address any questions or concerns that you may have regarding the information given to you following your procedure. If we do not reach you, we will leave a message.  However, if you are feeling well and you are not experiencing any problems, there is no need to return our call.  We will assume that you have returned to your regular daily activities without incident.  If any biopsies were taken you will be contacted by phone or by letter within the next 1-3 weeks.  Please call us at [phone] if you have not heard about the biopsies in 3 weeks.    SIGNATURES/CONFIDENTIALITY: You and/or your care partner have signed paperwork which will be entered into your electronic medical record.  These signatures attest to the fact that that the information above on your After Visit Summary has been reviewed and is understood.  Full responsibility of the confidentiality of this discharge information lies with you and/or your care-partner.

## 2017-10-16 NOTE — Progress Notes (Signed)
Report given to PACU, vss 

## 2017-10-16 NOTE — Progress Notes (Signed)
Called to room to assist during endoscopic procedure.  Patient ID and intended procedure confirmed with present staff. Received instructions for my participation in the procedure from the performing physician.  

## 2017-10-19 ENCOUNTER — Telehealth: Payer: Self-pay | Admitting: *Deleted

## 2017-10-19 ENCOUNTER — Telehealth: Payer: Self-pay

## 2017-10-19 NOTE — Telephone Encounter (Signed)
Called and LM on home and cell to call back to schedule appt for hemorrhoidal bandings.

## 2017-10-19 NOTE — Telephone Encounter (Signed)
-----   Message from Yetta Flock, MD sent at 10/16/2017  3:32 PM EDT ----- Regarding: banding to schedule Jan this is nonurgent, can be taken care of sometime next week. This patient is interested in hemorrhoid banding if you can schedule, routine. Thanks

## 2017-10-19 NOTE — Telephone Encounter (Signed)
  Follow up Call-  Call back number 10/16/2017 05/16/2015  Post procedure Call Back phone  # [phone]  Permission to leave phone message Yes Yes  Some recent data might be hidden     Patient questions:  Message left to call us if necessary.

## 2017-10-19 NOTE — Telephone Encounter (Signed)
  Follow up Call-  Call back number 10/16/2017 05/16/2015  Post procedure Call Back phone  # [phone]  Permission to leave phone message Yes Yes  Some recent data might be hidden     Patient questions:  Do you have a fever, pain , or abdominal swelling? No. Pain Score  0 *  Have you tolerated food without any problems? Yes.    Have you been able to return to your normal activities? Yes.    Do you have any questions about your discharge instructions: Diet   No. Medications  No. Follow up visit  No.  Do you have questions or concerns about your Care? No.  Actions: * If pain score is 4 or above: No action needed, pain <4.

## 2017-10-19 NOTE — Telephone Encounter (Signed)
Called pt and scheduled her for 1st and 2nd banding appt with Dr. Havery Moros.

## 2017-10-21 ENCOUNTER — Encounter (HOSPITAL_COMMUNITY): Payer: Self-pay | Admitting: Psychiatry

## 2017-10-21 ENCOUNTER — Ambulatory Visit (HOSPITAL_COMMUNITY): Payer: No Typology Code available for payment source | Admitting: Psychiatry

## 2017-10-21 DIAGNOSIS — Z87891 Personal history of nicotine dependence: Secondary | ICD-10-CM

## 2017-10-21 DIAGNOSIS — F4312 Post-traumatic stress disorder, chronic: Secondary | ICD-10-CM

## 2017-10-21 DIAGNOSIS — F331 Major depressive disorder, recurrent, moderate: Secondary | ICD-10-CM

## 2017-10-21 DIAGNOSIS — F330 Major depressive disorder, recurrent, mild: Secondary | ICD-10-CM | POA: Diagnosis not present

## 2017-10-21 DIAGNOSIS — F411 Generalized anxiety disorder: Secondary | ICD-10-CM

## 2017-10-21 MED ORDER — GABAPENTIN 400 MG PO CAPS: 400.0000 mg | ORAL_CAPSULE | Freq: Every day | ORAL | 0 refills | Status: DC

## 2017-10-21 MED ORDER — DULOXETINE HCL 60 MG PO CPEP: 120.0000 mg | ORAL_CAPSULE | Freq: Every day | ORAL | 0 refills | Status: DC

## 2017-10-21 MED FILL — GABAPENTIN 400 MG CAPSULE: 400 | 90 days supply | Qty: 90 | Fill #0

## 2017-10-21 MED FILL — DULoxetine HCL 60 MG CPEP: 60 | 90 days supply | Qty: 180 | Fill #0

## 2017-10-21 NOTE — Progress Notes (Signed)
[place] MD/PA/NP OP Progress Note  10/21/2017 3:56 PM Jillian Stewart  MRN:  [id number]  Chief Complaint: med check  HPI: Jillian Stewart reports significant improvement of mood/anxiety. Tolerating the regimen well and no significant side effects. Reiterated recommendation to take Cymbalta daily in AM instead of qhs so as to avoid issues with sleep.  gabapneitn has been quite helpful for arm nerve pain.  Denies any other acute concerns and will rtc in 3 months.  Disclosed to patient that this Probation officer is leaving this practice at the end of August 2019, and patients always has the right to choose their provider. Reassured patient that office will work to provide smooth transition of care whether they wish to remain at this office, or to continue with this provider, or seek alternative care options in community.  They expressed understanding.  Visit Diagnosis:    ICD-10-CM   1. GAD (generalized anxiety disorder) F41.1 gabapentin (NEURONTIN) 400 MG capsule  2. Chronic post-traumatic stress disorder (PTSD) F43.12 gabapentin (NEURONTIN) 400 MG capsule  3. Moderate episode of recurrent major depressive disorder (HCC) F33.1 gabapentin (NEURONTIN) 400 MG capsule  4. Mild episode of recurrent major depressive disorder (HCC) F33.0 DULoxetine (CYMBALTA) 60 MG capsule    Past Psychiatric History: See intake H&P for full details. Reviewed, with no updates at this time.   Past Medical History:  Past Medical History:  Diagnosis Date  . Anxiety   . Arthritis   . Chronic constipation   . Colon polyps   . Complication of anesthesia   . Depression   . GERD (gastroesophageal reflux disease)   . Hyperlipidemia   . Hypertension   . PONV (postoperative nausea and vomiting)     Past Surgical History:  Procedure Laterality Date  . BREAST SURGERY Right   . HIP SURGERY     right and left.    . OVARIAN CYST SURGERY Left   . RHINOPLASTY    . TOTAL SHOULDER ARTHROPLASTY Right 03/19/2017   Procedure: TOTAL SHOULDER  ARTHROPLASTY;  Surgeon: Justice Britain, MD;  Location: [facility];  Service: Orthopedics;  Laterality: Right;    Family Psychiatric History: See intake H&P for full details. Reviewed, with no updates at this time.   Family History:  Family History  Problem Relation Age of Onset  . Cancer Mother        cholangiocarcinoma  . Stroke Father   . Colon polyps Father   . Diabetes Maternal Grandmother     Social History:  Social History   Socioeconomic History  . Marital status: Married    Spouse name: Not on file  . Number of children: 3   . Years of education: Not on file  . Highest education level: Not on file  Occupational History  . Occupation: retired  Scientific laboratory technician  . Financial resource strain: Not on file  . Food insecurity:    Worry: Not on file    Inability: Not on file  . Transportation needs:    Medical: Not on file    Non-medical: Not on file  Tobacco Use  . Smoking status: Former Smoker    Types: Cigarettes    Last attempt to quit: 03/13/2003    Years since quitting: 14.6  . Smokeless tobacco: Never Used  Substance and Sexual Activity  . Alcohol use: Yes    Alcohol/week: 1.0 - 2.0 standard drinks    Types: 1 - 2 Cans of beer per week  . Drug use: Yes    Types: Marijuana  Comment: Reports infrequently  . Sexual activity: Yes    Birth control/protection: Post-menopausal  Lifestyle  . Physical activity:    Days per week: Not on file    Minutes per session: Not on file  . Stress: Not on file  Relationships  . Social connections:    Talks on phone: Not on file    Gets together: Not on file    Attends religious service: Not on file    Active member of club or organization: Not on file    Attends meetings of clubs or organizations: Not on file    Relationship status: Not on file  Other Topics Concern  . Not on file  Social History Narrative   Walks for exercise daily. 3 daughters.      Allergies:  Allergies  Allergen Reactions  . Other Other (See  Comments)    Anesthesia - sick for 3 days   . Statins Other (See Comments)    Severe muscle cramps. Pravastatin, Simvastatin, Atorvastatin, & Livalo  . Welchol [Colesevelam] Other (See Comments)    GI issues.   Chauncey Cruel [Ezetimibe] Other (See Comments)    Muscle cramps    Metabolic Disorder Labs: Lab Results  Component Value Date   HGBA1C 5.5 12/04/2013   No results found for: PROLACTIN Lab Results  Component Value Date   CHOL 241 (H) 02/11/2017   TRIG 90 02/11/2017   HDL 72 02/11/2017   CHOLHDL 3.3 02/11/2017   VLDL 14 01/25/2016   LDLCALC 149 (H) 02/11/2017   LDLCALC 166 (H) 01/25/2016   Lab Results  Component Value Date   TSH 2.72 02/11/2017   TSH 2.13 01/25/2016    Therapeutic Level Labs: No results found for: LITHIUM No results found for: VALPROATE No components found for:  CBMZ  Current Medications: Current Outpatient Medications  Medication Sig Dispense Refill  . amLODipine (NORVASC) 10 MG tablet Take 1 tablet (10 mg total) by mouth daily. 90 tablet 1  . aspirin 81 MG chewable tablet Chew 81 mg by mouth daily.    Marland Kitchen BIOTIN PO Take 1 tablet by mouth daily.    . Calcium-Magnesium-Vitamin D (CALCIUM 500 PO) Take 500 mg by mouth daily.    . DULoxetine (CYMBALTA) 60 MG capsule Take 2 capsules (120 mg total) by mouth daily. 180 capsule 0  . gabapentin (NEURONTIN) 400 MG capsule Take 1 capsule (400 mg total) by mouth at bedtime. 90 capsule 0  . Multiple Vitamins-Minerals (MULTIVITAMINS) CHEW Chew 1 tablet by mouth daily.    . naproxen sodium (ALEVE) 220 MG tablet Take 440 mg by mouth daily.    . Probiotic Product (DIGESTIVE ADVANTAGE PO) Take 1 capsule by mouth daily.    . diazepam (VALIUM) 5 MG tablet Take 0.5-1 tablets (2.5-5 mg total) by mouth every 6 (six) hours as needed for muscle spasms or sedation. (Patient not taking: Reported on 09/24/2017) 40 tablet 1  . fluticasone (FLONASE) 50 MCG/ACT nasal spray One spray in each nostril twice a day, use left hand for  right nostril, and right hand for left nostril. (Patient not taking: Reported on 10/21/2017) 48 g 3   No current facility-administered medications for this visit.      Musculoskeletal: Strength & Muscle Tone: within normal limits Gait & Station: normal Patient leans: N/A  Psychiatric Specialty Exam: ROS  Blood pressure 114/70, pulse 84, height 5' 2.5" (1.588 m), weight 132 lb (59.9 kg), SpO2 99 %.Body mass index is 23.76 kg/m.  General Appearance: Casual and Well  Groomed  Eye Contact:  Good  Speech:  Clear and Coherent and Normal Rate  Volume:  Normal  Mood:  Euthymic  Affect:  Congruent  Thought Process:  Goal Directed and Descriptions of Associations: Intact  Orientation:  Full (Time, Place, and Person)  Thought Content: Logical   Suicidal Thoughts:  No  Homicidal Thoughts:  No  Memory:  Immediate;   Good  Judgement:  Good  Insight:  Good  Psychomotor Activity:  Normal  Concentration:  Concentration: Good  Recall:  Good  Fund of Knowledge: Good  Language: Good  Akathisia:  Negative  Handed:  Right  AIMS (if indicated): not done  Assets:  Communication Skills Desire for Improvement Financial Resources/Insurance Housing  ADL's:  Intact  Cognition: WNL  Sleep:  Good   Screenings: PHQ2-9     Office Visit from 02/18/2017 in [facility] Office Visit from 04/04/2015 in [facility]  PHQ-2 Total Score  0  0      Assessment and Plan:  Jillian Stewart presents with general improvement of her symptoms on Cymbalta 60 mg twice a day, and reports that her her mood is generally euthymic and she feels really quite good.  No acute safety concerns and she is agreeable to follow-up in 3 months or sooner if needed.  1. GAD (generalized anxiety disorder)   2. Chronic post-traumatic stress disorder (PTSD)   3. Moderate episode of recurrent major depressive disorder ([place])   4. Mild episode of recurrent major  depressive disorder (HCC)     Status of current problems: stable  Labs Ordered: No orders of the defined types were placed in this encounter.   Labs Reviewed: NA  Collateral Obtained/Records Reviewed: N/A  Plan:  Cymbalta 60 mg twice a day Gabapentin increased to 400 mg for sleep and nerve pain  Aundra Dubin, MD 10/21/2017, 3:56 PM

## 2017-10-22 ENCOUNTER — Encounter: Payer: Self-pay | Admitting: Physician Assistant

## 2017-10-22 DIAGNOSIS — Z96643 Presence of artificial hip joint, bilateral: Secondary | ICD-10-CM

## 2017-10-22 DIAGNOSIS — M25551 Pain in right hip: Secondary | ICD-10-CM

## 2017-10-22 DIAGNOSIS — M25552 Pain in left hip: Secondary | ICD-10-CM

## 2017-10-23 NOTE — Telephone Encounter (Signed)
Orders placed  Pt advised

## 2017-10-25 ENCOUNTER — Encounter: Payer: Self-pay | Admitting: Family Medicine

## 2017-10-25 DIAGNOSIS — Z96643 Presence of artificial hip joint, bilateral: Secondary | ICD-10-CM

## 2017-10-26 ENCOUNTER — Encounter: Payer: Self-pay | Admitting: Physician Assistant

## 2017-10-26 DIAGNOSIS — Z96643 Presence of artificial hip joint, bilateral: Secondary | ICD-10-CM

## 2017-10-26 NOTE — Telephone Encounter (Signed)
Place order for heavy metal screen and cadmium.  We will just need to find out and call and see if the cobalt is on the regular heavy metal screen or if it has its own test code.  I need to find out what diagnosis presents patient is requesting this testing for.  What kind of symptoms is she having that we can link to these codes?

## 2017-10-27 ENCOUNTER — Ambulatory Visit (INDEPENDENT_AMBULATORY_CARE_PROVIDER_SITE_OTHER): Payer: No Typology Code available for payment source

## 2017-10-27 DIAGNOSIS — Z96643 Presence of artificial hip joint, bilateral: Secondary | ICD-10-CM

## 2017-10-27 DIAGNOSIS — M25552 Pain in left hip: Secondary | ICD-10-CM

## 2017-10-27 DIAGNOSIS — M25551 Pain in right hip: Secondary | ICD-10-CM

## 2017-10-27 NOTE — Telephone Encounter (Signed)
Tests ordered. Left message advising patient.

## 2017-11-01 ENCOUNTER — Encounter (HOSPITAL_COMMUNITY): Payer: Self-pay

## 2017-11-01 NOTE — Telephone Encounter (Signed)
Call pt: chromium still pending. colbalt is elevated but you have metal in your body due to replacements. Need to send to surgeon if he is concerned with levels. Please confirm contact information of exactly where to send.

## 2017-11-01 NOTE — Progress Notes (Signed)
Call pt: no complicating features seen. Do you want [place] to send this to surgeon. If you want actual image need to get copy on disc and mailed to him unless they are on epic and may be able to view via mychart.

## 2017-11-03 ENCOUNTER — Encounter (HOSPITAL_COMMUNITY): Payer: Self-pay

## 2017-11-04 LAB — COBALT, SERUM/PLASMA: COBALT, SERUM/PLASMA: 11.8 ug/L — AB (ref 0.1–0.4)

## 2017-11-04 LAB — CHROMIUM LEVEL: CHROMIUM: 9.2 ug/L — AB (ref <1.3)

## 2017-11-05 ENCOUNTER — Encounter: Payer: Self-pay | Admitting: Physician Assistant

## 2017-11-05 NOTE — Telephone Encounter (Signed)
Results need to be sent to surgeon that requested we order. Levels are elevated but she has reason too.

## 2017-11-06 ENCOUNTER — Encounter: Payer: Self-pay | Admitting: Physician Assistant

## 2017-12-08 ENCOUNTER — Other Ambulatory Visit: Payer: Self-pay | Admitting: Physician Assistant

## 2017-12-08 DIAGNOSIS — I10 Essential (primary) hypertension: Secondary | ICD-10-CM

## 2017-12-08 MED FILL — AMLODIPINE BESYLATE 10 MG T: 10 | 30 days supply | Qty: 30 | Fill #0

## 2017-12-10 ENCOUNTER — Encounter: Payer: Self-pay | Admitting: Gastroenterology

## 2017-12-10 ENCOUNTER — Ambulatory Visit: Payer: No Typology Code available for payment source | Admitting: Gastroenterology

## 2017-12-10 VITALS — BP 110/70 | HR 76 | Ht 62.75 in | Wt 134.2 lb

## 2017-12-10 DIAGNOSIS — K648 Other hemorrhoids: Secondary | ICD-10-CM

## 2017-12-10 DIAGNOSIS — K640 First degree hemorrhoids: Secondary | ICD-10-CM | POA: Diagnosis not present

## 2017-12-10 DIAGNOSIS — K5900 Constipation, unspecified: Secondary | ICD-10-CM

## 2017-12-10 MED ORDER — PLECANATIDE 3 MG PO TABS: 1.0000 | ORAL_TABLET | Freq: Every day | ORAL | 0 refills | Status: AC

## 2017-12-10 NOTE — Patient Instructions (Signed)
If you are age 64 or older, your body mass index should be between 23-30. Your Body mass index is 23.97 kg/m. If this is out of the aforementioned range listed, please consider follow up with your Primary Care Provider.  If you are age 64 or younger, your body mass index should be between 19-25. Your Body mass index is 23.97 kg/m. If this is out of the aformentioned range listed, please consider follow up with your Primary Care Provider.   We are giving you samples of Trulance: Take 1 tablet by mouth daily.  Your next hemorrhoid bandings are Friday, 12-25-2017 at 4:00 pm and   Friday 01-15-18 at 4:00 pm.  HEMORRHOID BANDING PROCEDURE    FOLLOW-UP CARE   1. The procedure you have had should have been relatively painless since the banding of the area involved does not have nerve endings and there is no pain sensation.  The rubber band cuts off the blood supply to the hemorrhoid and the band may fall off as soon as 48 hours after the banding (the band may occasionally be seen in the toilet bowl following a bowel movement). You may notice a temporary feeling of fullness in the rectum which should respond adequately to plain Tylenol or Motrin.  2. Following the banding, avoid strenuous exercise that evening and resume full activity the next day.  A sitz bath (soaking in a warm tub) or bidet is soothing, and can be useful for cleansing the area after bowel movements.     3. To avoid constipation, take two tablespoons of natural wheat bran, natural oat bran, flax, Benefiber or any over the counter fiber supplement and increase your water intake to 7-8 glasses daily.    4. Unless you have been prescribed anorectal medication, do not put anything inside your rectum for two weeks: No suppositories, enemas, fingers, etc.  5. Occasionally, you may have more bleeding than usual after the banding procedure.  This is often from the untreated hemorrhoids rather than the treated one.  Don't be concerned  if there is a tablespoon or so of blood.  If there is more blood than this, lie flat with your bottom higher than your head and apply an ice pack to the area. If the bleeding does not stop within a half an hour or if you feel faint, call our office at [phone] or go to the emergency room.  6. Problems are not common; however, if there is a substantial amount of bleeding, severe pain, chills, fever or difficulty passing urine (very rare) or other problems, you should call us at [phone] or report to the nearest emergency room.  7. Do not stay seated continuously for more than 2-3 hours for a day or two after the procedure.  Tighten your buttock muscles 10-15 times every two hours and take 10-15 deep breaths every 1-2 hours.  Do not spend more than a few minutes on the toilet if you cannot empty your bowel; instead re-visit the toilet at a later time.    Thank you for entrusting me with your care and for choosing [facility], Dr. [place] Cellar

## 2017-12-10 NOTE — Progress Notes (Signed)
62 y/o female with symptomatic hemorrhoids - swelling and occasional bleeding, asking for treatment of internal hemorrhoids. She also has ongoing constipation, Linzess did not help, interestingly Miralax at once daily dose appears too strong. She had a recent colonoscopy which showed small internal hemorrhoids.   PROCEDURE NOTE: The patient presents with symptomatic grade I  hemorrhoids, requesting rubber band ligation of his/her hemorrhoidal disease.  All risks, benefits and alternative forms of therapy were described and informed consent was obtained.   The anorectum was pre-medicated with 0.125% nitroglycerin The decision was made to band the LL internal hemorrhoid, and the [place] was used to perform band ligation without complication.  Digital anorectal examination was then performed to assure proper positioning of the band, and to adjust the banded tissue as required.  The patient was discharged home without pain or other issues.  Dietary and behavioral recommendations were given and along with follow-up instructions.     The following adjunctive treatments were recommended: Trulance once daily trial for 2 weeks for constipation.   The patient will return in 2-4 weeks for  follow-up and possible additional banding as required. No complications were encountered and the patient tolerated the procedure well.  Verona Cellar, MD [facility]

## 2017-12-16 MED FILL — traMADol HCL 50 MG TABS: 50 | 30 days supply | Qty: 30 | Fill #0

## 2017-12-25 ENCOUNTER — Encounter: Payer: Self-pay | Admitting: Gastroenterology

## 2017-12-30 ENCOUNTER — Telehealth: Payer: Self-pay

## 2017-12-30 NOTE — Telephone Encounter (Signed)
Per pt request, results from metal testing done in August faxed to Dr Shawn Stall Su  Phone: [phone]  Fax: [phone]

## 2018-01-11 MED FILL — AMLODIPINE BESYLATE 5 MG TA: 5 | 90 days supply | Qty: 90 | Fill #1

## 2018-01-13 ENCOUNTER — Encounter: Payer: Self-pay | Admitting: Physician Assistant

## 2018-01-14 MED FILL — traMADol HCL 50 MG TABS: 50 | 30 days supply | Qty: 30 | Fill #0

## 2018-01-15 ENCOUNTER — Encounter: Payer: Self-pay | Admitting: Gastroenterology

## 2018-01-15 ENCOUNTER — Other Ambulatory Visit: Payer: Self-pay | Admitting: Physician Assistant

## 2018-01-15 DIAGNOSIS — Z1231 Encounter for screening mammogram for malignant neoplasm of breast: Secondary | ICD-10-CM

## 2018-01-19 ENCOUNTER — Ambulatory Visit: Payer: No Typology Code available for payment source | Admitting: Gastroenterology

## 2018-01-19 ENCOUNTER — Encounter: Payer: Self-pay | Admitting: Gastroenterology

## 2018-01-19 VITALS — BP 124/80 | HR 80 | Ht 63.0 in | Wt 137.8 lb

## 2018-01-19 DIAGNOSIS — K649 Unspecified hemorrhoids: Secondary | ICD-10-CM

## 2018-01-19 DIAGNOSIS — K640 First degree hemorrhoids: Secondary | ICD-10-CM | POA: Diagnosis not present

## 2018-01-19 NOTE — Progress Notes (Signed)
62 y/o female here for follow up - wid well post initial banding on 12/10/17, tolerated it well (LL banded). She was tried on Trulance and did not help her much at all for her constipation. She also failed Linzess. Miralax tends to work for her although can be too strong at times. We discussed options. She wishes to continue to therapy with banding after a discussion of options. On DRE today she has normal tone and normal decent, I think pelvic floor dysfunction is unlikely. June McMurray CMA as standby today.    PROCEDURE NOTE: The patient presents with symptomatic grade I  hemorrhoids, requesting rubber band ligation of his/her hemorrhoidal disease.  All risks, benefits and alternative forms of therapy were described and informed consent was obtained.   The anorectum was pre-medicated with 0.125% nitroglycerin The decision was made to band the RP internal hemorrhoid, and the [place] was used to perform band ligation without complication.  Digital anorectal examination was then performed to assure proper positioning of the band, and to adjust the banded tissue as required.  The patient was discharged home without pain or other issues.  Dietary and behavioral recommendations were given and along with follow-up instructions.     The following adjunctive treatments were recommended: Use Miralax 1/2 cap to one cap daily to prevent constipation  The patient will return in 2-4 weeks for  follow-up and possible additional banding as required. No complications were encountered and the patient tolerated the procedure well.  Martha Cellar, MD [facility]

## 2018-01-19 NOTE — Patient Instructions (Addendum)
You may use Miralax as discussed with Dr. Havery Moros.  Please follow up on 02/09/18 at 4:00pm to discuss possible additional banding

## 2018-01-27 ENCOUNTER — Telehealth: Payer: Self-pay | Admitting: Physician Assistant

## 2018-01-27 ENCOUNTER — Encounter: Payer: Self-pay | Admitting: Physician Assistant

## 2018-01-27 ENCOUNTER — Ambulatory Visit (INDEPENDENT_AMBULATORY_CARE_PROVIDER_SITE_OTHER): Payer: No Typology Code available for payment source | Admitting: Physician Assistant

## 2018-01-27 VITALS — BP 114/90 | HR 76 | Ht 63.0 in | Wt 132.0 lb

## 2018-01-27 DIAGNOSIS — Z122 Encounter for screening for malignant neoplasm of respiratory organs: Secondary | ICD-10-CM

## 2018-01-27 DIAGNOSIS — Z0000 Encounter for general adult medical examination without abnormal findings: Secondary | ICD-10-CM

## 2018-01-27 DIAGNOSIS — Z23 Encounter for immunization: Secondary | ICD-10-CM | POA: Diagnosis not present

## 2018-01-27 DIAGNOSIS — Z13220 Encounter for screening for lipoid disorders: Secondary | ICD-10-CM

## 2018-01-27 DIAGNOSIS — F330 Major depressive disorder, recurrent, mild: Secondary | ICD-10-CM

## 2018-01-27 DIAGNOSIS — Z13820 Encounter for screening for osteoporosis: Secondary | ICD-10-CM | POA: Diagnosis not present

## 2018-01-27 DIAGNOSIS — Z780 Asymptomatic menopausal state: Secondary | ICD-10-CM

## 2018-01-27 DIAGNOSIS — Z87891 Personal history of nicotine dependence: Secondary | ICD-10-CM

## 2018-01-27 DIAGNOSIS — Z1159 Encounter for screening for other viral diseases: Secondary | ICD-10-CM

## 2018-01-27 DIAGNOSIS — I10 Essential (primary) hypertension: Secondary | ICD-10-CM

## 2018-01-27 DIAGNOSIS — Z131 Encounter for screening for diabetes mellitus: Secondary | ICD-10-CM

## 2018-01-27 DIAGNOSIS — M25511 Pain in right shoulder: Secondary | ICD-10-CM

## 2018-01-27 MED ORDER — AMLODIPINE BESYLATE 10 MG PO TABS: 10.0000 mg | ORAL_TABLET | Freq: Every day | ORAL | 3 refills | Status: DC

## 2018-01-27 MED ORDER — DICLOFENAC SODIUM 1 % TD GEL: 4.0000 g | Freq: Four times a day (QID) | TRANSDERMAL | 1 refills | Status: AC

## 2018-01-27 MED ORDER — DULOXETINE HCL 60 MG PO CPEP: 120.0000 mg | ORAL_CAPSULE | Freq: Every day | ORAL | 3 refills | Status: AC

## 2018-01-27 MED FILL — DULoxetine HCL 60 MG CPEP: 60 | 90 days supply | Qty: 180 | Fill #0

## 2018-01-27 MED FILL — DICLOFENAC SODIUM 1% GEL: 1 | 7 days supply | Qty: 100 | Fill #0

## 2018-01-27 MED FILL — AMLODIPINE BESYLATE 10 MG T: 10 | 90 days supply | Qty: 90 | Fill #0

## 2018-01-27 NOTE — Progress Notes (Signed)
Subjective:     Jillian Stewart is a 62 y.o. female and is here for a comprehensive physical exam. The patient reports no problems.  Social History   Socioeconomic History  . Marital status: Married    Spouse name: Not on file  . Number of children: 3   . Years of education: Not on file  . Highest education level: Not on file  Occupational History  . Occupation: retired  Scientific laboratory technician  . Financial resource strain: Not on file  . Food insecurity:    Worry: Not on file    Inability: Not on file  . Transportation needs:    Medical: Not on file    Non-medical: Not on file  Tobacco Use  . Smoking status: Former Smoker    Types: Cigarettes    Last attempt to quit: 03/13/2003    Years since quitting: 14.8  . Smokeless tobacco: Never Used  Substance and Sexual Activity  . Alcohol use: Yes    Alcohol/week: 1.0 - 2.0 standard drinks    Types: 1 - 2 Cans of beer per week  . Drug use: Yes    Types: Marijuana    Comment: Reports infrequently  . Sexual activity: Yes    Birth control/protection: Post-menopausal  Lifestyle  . Physical activity:    Days per week: Not on file    Minutes per session: Not on file  . Stress: Not on file  Relationships  . Social connections:    Talks on phone: Not on file    Gets together: Not on file    Attends religious service: Not on file    Active member of club or organization: Not on file    Attends meetings of clubs or organizations: Not on file    Relationship status: Not on file  . Intimate partner violence:    Fear of current or ex partner: Not on file    Emotionally abused: Not on file    Physically abused: Not on file    Forced sexual activity: Not on file  Other Topics Concern  . Not on file  Social History Narrative   Walks for exercise daily. 3 daughters.     Health Maintenance  Topic Date Due  . Hepatitis C Screening  02/18/2018 (Originally 1956-02-13)  . DEXA SCAN  02/19/2019 (Originally 10/05/2015)  . MAMMOGRAM  02/13/2019  .  PAP SMEAR  02/10/2020  . TETANUS/TDAP  03/10/2021  . COLONOSCOPY  10/17/2022  . INFLUENZA VACCINE  Completed  . HIV Screening  Completed    The following portions of the patient's history were reviewed and updated as appropriate: allergies, current medications, past family history, past medical history, past social history, past surgical history and problem list.  Review of Systems Pertinent items noted in HPI and remainder of comprehensive ROS otherwise negative.   Objective:    BP 114/90   Pulse 76   Ht 5\' 3"  (1.6 m)   Wt 132 lb (59.9 kg)   BMI 23.38 kg/m  General appearance: alert, cooperative and appears stated age Head: Normocephalic, without obvious abnormality, atraumatic Eyes: conjunctivae/corneas clear. PERRL, EOM's intact. Fundi benign. Ears: normal TM's and external ear canals both ears Nose: Nares normal. Septum midline. Mucosa normal. No drainage or sinus tenderness. Throat: lips, mucosa, and tongue normal; teeth and gums normal Neck: no adenopathy, no carotid bruit, no JVD, supple, symmetrical, trachea midline and thyroid not enlarged, symmetric, no tenderness/mass/nodules Back: symmetric, no curvature. ROM normal. No CVA tenderness. Lungs: clear to  auscultation bilaterally Heart: regular rate and rhythm, S1, S2 normal, no murmur, click, rub or gallop Abdomen: soft, non-tender; bowel sounds normal; no masses,  no organomegaly Extremities: extremities normal, atraumatic, no cyanosis or edema and right shoulder decreased ROM with active abduction and tenderness over anterior shoulder and with bicep resistance. Pulses: 2+ and symmetric Skin: Skin color, texture, turgor normal. No rashes or lesions Lymph nodes: Cervical, supraclavicular, and axillary nodes normal. Neurologic: Alert and oriented X 3, normal strength and tone. Normal symmetric reflexes. Normal coordination and gait    Assessment:    Healthy female exam.      Plan:    Marland KitchenMarland KitchenEmmalou was seen today for  annual exam.  Diagnoses and all orders for this visit:  Routine physical examination -     DG Bone Density -     Lipid Panel w/reflex Direct LDL -     COMPLETE METABOLIC PANEL WITH GFR -     CBC with Differential/Platelet -     TSH  Post-menopausal -     DG Bone Density  Osteoporosis screening -     DG Bone Density  Screening for lipid disorders -     Lipid Panel w/reflex Direct LDL  Screening for diabetes mellitus -     COMPLETE METABOLIC PANEL WITH GFR  Need for hepatitis C screening test -     Hepatitis C Antibody  Need for immunization against influenza -     Flu Vaccine QUAD 36+ mos IM  Former smoker -     Ambulatory Referral for Lung Cancer Scre  Encounter for screening for lung cancer -     Ambulatory Referral for Lung Cancer Scre  Acute pain of right shoulder -     diclofenac sodium (VOLTAREN) 1 % GEL; Apply 4 g topically 4 (four) times daily. To affected joint.   .. Depression screen [facility] 2/9 01/28/2018 02/18/2017 04/04/2015  Decreased Interest 0 0 0  Down, Depressed, Hopeless 0 0 0  PHQ - 2 Score 0 0 0  Altered sleeping 0 - -  Tired, decreased energy 0 - -  Change in appetite 0 - -  Feeling bad or failure about yourself  0 - -  Trouble concentrating 0 - -  Moving slowly or fidgety/restless 0 - -  Suicidal thoughts 0 - -  PHQ-9 Score 0 - -  Difficult doing work/chores Not difficult at all - -   .Marland Kitchen Discussed 150 minutes of exercise a week.  Encouraged vitamin D 1000 units and Calcium 1300mg  or 4 servings of dairy a day.  DEXA ordered to have done with mammogram.  Colonoscopy up to date.  Labs ordered.   Question if she would qualify for lung cancer low dose CT screening. Quit smoking 15 years ago. Will order and let them screen.   Pain does not seem the same as what caused her to get a shoulder replacement. I suspect some bicep tendonitis. Use diclofenac over anterior shoulder. Can also use oral NSAIDs for the next few days. Given exercises to  start. Ice shoulder.  Consider follow up with sports medicine.  See After Visit Summary for Counseling Recommendations

## 2018-01-27 NOTE — Patient Instructions (Addendum)
Health Maintenance for Postmenopausal Women Menopause is a normal process in which your reproductive ability comes to an end. This process happens gradually over a span of months to years, usually between the ages of 22 and 9. Menopause is complete when you have missed 12 consecutive menstrual periods. It is important to talk with your health care provider about some of the most common conditions that affect postmenopausal women, such as heart disease, cancer, and bone loss (osteoporosis). Adopting a healthy lifestyle and getting preventive care can help to promote your health and wellness. Those actions can also lower your chances of developing some of these common conditions. What should I know about menopause? During menopause, you may experience a number of symptoms, such as:  Moderate-to-severe hot flashes.  Night sweats.  Decrease in sex drive.  Mood swings.  Headaches.  Tiredness.  Irritability.  Memory problems.  Insomnia.  Choosing to treat or not to treat menopausal changes is an individual decision that you make with your health care provider. What should I know about hormone replacement therapy and supplements? Hormone therapy products are effective for treating symptoms that are associated with menopause, such as hot flashes and night sweats. Hormone replacement carries certain risks, especially as you become older. If you are thinking about using estrogen or estrogen with progestin treatments, discuss the benefits and risks with your health care provider. What should I know about heart disease and stroke? Heart disease, heart attack, and stroke become more likely as you age. This may be due, in part, to the hormonal changes that your body experiences during menopause. These can affect how your body processes dietary fats, triglycerides, and cholesterol. Heart attack and stroke are both medical emergencies. There are many things that you can do to help prevent heart disease  and stroke:  Have your blood pressure checked at least every 1-2 years. High blood pressure causes heart disease and increases the risk of stroke.  If you are 53-22 years old, ask your health care provider if you should take aspirin to prevent a heart attack or a stroke.  Do not use any tobacco products, including cigarettes, chewing tobacco, or electronic cigarettes. If you need help quitting, ask your health care provider.  It is important to eat a healthy diet and maintain a healthy weight. ? Be sure to include plenty of vegetables, fruits, low-fat dairy products, and lean protein. ? Avoid eating foods that are high in solid fats, added sugars, or salt (sodium).  Get regular exercise. This is one of the most important things that you can do for your health. ? Try to exercise for at least 150 minutes each week. The type of exercise that you do should increase your heart rate and make you sweat. This is known as moderate-intensity exercise. ? Try to do strengthening exercises at least twice each week. Do these in addition to the moderate-intensity exercise.  Know your numbers.Ask your health care provider to check your cholesterol and your blood glucose. Continue to have your blood tested as directed by your health care provider.  What should I know about cancer screening? There are several types of cancer. Take the following steps to reduce your risk and to catch any cancer development as early as possible. Breast Cancer  Practice breast self-awareness. ? This means understanding how your breasts normally appear and feel. ? It also means doing regular breast self-exams. Let your health care provider know about any changes, no matter how small.  If you are 40  or older, have a clinician do a breast exam (clinical breast exam or CBE) every year. Depending on your age, family history, and medical history, it may be recommended that you also have a yearly breast X-ray (mammogram).  If you  have a family history of breast cancer, talk with your health care provider about genetic screening.  If you are at high risk for breast cancer, talk with your health care provider about having an MRI and a mammogram every year.  Breast cancer (BRCA) gene test is recommended for women who have family members with BRCA-related cancers. Results of the assessment will determine the need for genetic counseling and BRCA1 and for BRCA2 testing. BRCA-related cancers include these types: ? Breast. This occurs in males or females. ? Ovarian. ? Tubal. This may also be called fallopian tube cancer. ? Cancer of the abdominal or pelvic lining (peritoneal cancer). ? Prostate. ? Pancreatic.  Cervical, Uterine, and Ovarian Cancer Your health care provider may recommend that you be screened regularly for cancer of the pelvic organs. These include your ovaries, uterus, and vagina. This screening involves a pelvic exam, which includes checking for microscopic changes to the surface of your cervix (Pap test).  For women ages 21-65, health care providers may recommend a pelvic exam and a Pap test every three years. For women ages 79-65, they may recommend the Pap test and pelvic exam, combined with testing for human papilloma virus (HPV), every five years. Some types of HPV increase your risk of cervical cancer. Testing for HPV may also be done on women of any age who have unclear Pap test results.  Other health care providers may not recommend any screening for nonpregnant women who are considered low risk for pelvic cancer and have no symptoms. Ask your health care provider if a screening pelvic exam is right for you.  If you have had past treatment for cervical cancer or a condition that could lead to cancer, you need Pap tests and screening for cancer for at least 20 years after your treatment. If Pap tests have been discontinued for you, your risk factors (such as having a new sexual partner) need to be  reassessed to determine if you should start having screenings again. Some women have medical problems that increase the chance of getting cervical cancer. In these cases, your health care provider may recommend that you have screening and Pap tests more often.  If you have a family history of uterine cancer or ovarian cancer, talk with your health care provider about genetic screening.  If you have vaginal bleeding after reaching menopause, tell your health care provider.  There are currently no reliable tests available to screen for ovarian cancer.  Lung Cancer Lung cancer screening is recommended for adults 69-62 years old who are at high risk for lung cancer because of a history of smoking. A yearly low-dose CT scan of the lungs is recommended if you:  Currently smoke.  Have a history of at least 30 pack-years of smoking and you currently smoke or have quit within the past 15 years. A pack-year is smoking an average of one pack of cigarettes per day for one year.  Yearly screening should:  Continue until it has been 15 years since you quit.  Stop if you develop a health problem that would prevent you from having lung cancer treatment.  Colorectal Cancer  This type of cancer can be detected and can often be prevented.  Routine colorectal cancer screening usually begins at  age 42 and continues through age 45.  If you have risk factors for colon cancer, your health care provider may recommend that you be screened at an earlier age.  If you have a family history of colorectal cancer, talk with your health care provider about genetic screening.  Your health care provider may also recommend using home test kits to check for hidden blood in your stool.  A small camera at the end of a tube can be used to examine your colon directly (sigmoidoscopy or colonoscopy). This is done to check for the earliest forms of colorectal cancer.  Direct examination of the colon should be repeated every  5-10 years until age 71. However, if early forms of precancerous polyps or small growths are found or if you have a family history or genetic risk for colorectal cancer, you may need to be screened more often.  Skin Cancer  Check your skin from head to toe regularly.  Monitor any moles. Be sure to tell your health care provider: ? About any new moles or changes in moles, especially if there is a change in a mole's shape or color. ? If you have a mole that is larger than the size of a pencil eraser.  If any of your family members has a history of skin cancer, especially at a young age, talk with your health care provider about genetic screening.  Always use sunscreen. Apply sunscreen liberally and repeatedly throughout the day.  Whenever you are outside, protect yourself by wearing long sleeves, pants, a wide-brimmed hat, and sunglasses.  What should I know about osteoporosis? Osteoporosis is a condition in which bone destruction happens more quickly than new bone creation. After menopause, you may be at an increased risk for osteoporosis. To help prevent osteoporosis or the bone fractures that can happen because of osteoporosis, the following is recommended:  If you are 46-71 years old, get at least 1,000 mg of calcium and at least 600 mg of vitamin D per day.  If you are older than age 55 but younger than age 65, get at least 1,200 mg of calcium and at least 600 mg of vitamin D per day.  If you are older than age 54, get at least 1,200 mg of calcium and at least 800 mg of vitamin D per day.  Smoking and excessive alcohol intake increase the risk of osteoporosis. Eat foods that are rich in calcium and vitamin D, and do weight-bearing exercises several times each week as directed by your health care provider. What should I know about how menopause affects my mental health? Depression may occur at any age, but it is more common as you become older. Common symptoms of depression  include:  Low or sad mood.  Changes in sleep patterns.  Changes in appetite or eating patterns.  Feeling an overall lack of motivation or enjoyment of activities that you previously enjoyed.  Frequent crying spells.  Talk with your health care provider if you think that you are experiencing depression. What should I know about immunizations? It is important that you get and maintain your immunizations. These include:  Tetanus, diphtheria, and pertussis (Tdap) booster vaccine.  Influenza every year before the flu season begins.  Pneumonia vaccine.  Shingles vaccine.  Your health care provider may also recommend other immunizations. This information is not intended to replace advice given to you by your health care provider. Make sure you discuss any questions you have with your health care provider. Document Released: 04/18/2005  Document Revised: 09/14/2015 Document Reviewed: 11/28/2014 Elsevier Interactive Patient Education  2018 [place].   Biceps Tendon Tendinitis (Proximal) and Tenosynovitis Rehab Ask your health care provider which exercises are safe for you. Do exercises exactly as told by your health care provider and adjust them as directed. It is normal to feel mild stretching, pulling, tightness, or discomfort as you do these exercises, but you should stop right away if you feel sudden pain or your pain gets worse.Do not begin these exercises until told by your health care provider. Stretching and range of motion exercises These exercises warm up your muscles and joints and improve the movement and flexibility of your arm and shoulder. These exercises also help to relieve pain and stiffness. Exercise A: Shoulder flexion  1. Stand facing a wall. Put your left / right hand on the wall. 2. Slide your left / right hand up the wall. Stop when you feel a stretch in your shoulder, or when you reach the angle that is recommended by your health care provider. ? Use your  other hand to help raise your arm, if needed. ? As your hand gets higher, you may need to step closer to the wall. ? Avoid shrugging your shoulder while you raise your arm. To do this, keep your shoulder blade tucked down toward your spine. 3. Hold for __________ seconds. 4. Slowly return to the starting position. Use your other arm to help, if needed. Repeat __________ times. Complete this exercise __________ times a day. Exercise B: Posterior capsule stretch ( passive horizontal adduction) 1. Sit or stand and pull your left / right elbow across your chest, toward your other shoulder. Stop when you feel a gentle stretch in the back of your shoulder and upper arm. ? Keep your arm at shoulder height. ? Keep your arm as close to your body as you comfortably can. 2. Hold for __________ seconds. 3. Slowly return to the starting position. Repeat __________ times. Complete this exercise __________ times a day. Strengthening exercises These exercises build strength and endurance in your arm and shoulder. Endurance is the ability to use your muscles for a long time, even after your muscles get tired. Exercise C: Elbow flexion, supinated  1. Sit on a stable chair without armrests, or stand. 2. If directed, hold a __________ weight in your left / right hand, or hold an exercise band with both hands. Your palms should face up toward the ceiling at the starting position. 3. Bend your left / right elbow and move your hand up toward your shoulder. Keep your other arm straight down, in the starting position. 4. Slowly return to the starting position. Repeat __________ times. Complete this exercise __________ times a day. Exercise D: Scapular protraction, supine  1. Lie on your back on a firm surface. If directed, hold a __________ weight in your left / right hand. 2. Raise your left / right arm straight into the air so your hand is directly above your shoulder joint. 3. Push the weight into the air so  your shoulder lifts off of the surface that you are lying on. Do not move your head, neck, or back. 4. Hold for __________ seconds. 5. Slowly return to the starting position. Let your muscles relax completely before you repeat this exercise. Repeat __________ times. Complete this exercise __________ times a day. Exercise E: Scapular retraction  1. Sit in a stable chair without armrests, or stand. 2. Secure an exercise band to a stable object in front of  you so the band is at shoulder height. 3. Hold one end of the exercise band in each hand. 4. Squeeze your shoulder blades together and move your elbows slightly behind you. Do not shrug your shoulders. 5. Hold for __________ seconds. 6. Slowly return to the starting position. Repeat __________ times. Complete this exercise __________ times a day. This information is not intended to replace advice given to you by your health care provider. Make sure you discuss any questions you have with your health care provider. Document Released: 02/24/2005 Document Revised: 11/01/2015 Document Reviewed: 02/02/2015 Elsevier Interactive Patient Education  [employer].

## 2018-01-27 NOTE — Telephone Encounter (Signed)
Left pt VM advising RXs sent in

## 2018-01-27 NOTE — Telephone Encounter (Signed)
Ok to send 90 day supply with 3 refills.

## 2018-01-27 NOTE — Telephone Encounter (Signed)
Had appt today.. OK to RF?

## 2018-01-27 NOTE — Telephone Encounter (Signed)
Patient requesting refills on Cymbalta and Norvasc.  Thanks

## 2018-01-28 ENCOUNTER — Encounter: Payer: Self-pay | Admitting: Physician Assistant

## 2018-02-09 ENCOUNTER — Ambulatory Visit: Payer: No Typology Code available for payment source | Admitting: Gastroenterology

## 2018-02-09 ENCOUNTER — Encounter: Payer: Self-pay | Admitting: Gastroenterology

## 2018-02-09 VITALS — BP 114/70 | HR 70 | Ht 63.0 in | Wt 139.5 lb

## 2018-02-09 DIAGNOSIS — K649 Unspecified hemorrhoids: Secondary | ICD-10-CM

## 2018-02-09 NOTE — Patient Instructions (Signed)
If you are age 64 or older, your body mass index should be between 23-30. Your Body mass index is 24.71 kg/m. If this is out of the aforementioned range listed, please consider follow up with your Primary Care Provider.  If you are age 89 or younger, your body mass index should be between 19-25. Your Body mass index is 24.71 kg/m. If this is out of the aformentioned range listed, please consider follow up with your Primary Care Provider.   HEMORRHOID BANDING PROCEDURE    FOLLOW-UP CARE   1. The procedure you have had should have been relatively painless since the banding of the area involved does not have nerve endings and there is no pain sensation.  The rubber band cuts off the blood supply to the hemorrhoid and the band may fall off as soon as 48 hours after the banding (the band may occasionally be seen in the toilet bowl following a bowel movement). You may notice a temporary feeling of fullness in the rectum which should respond adequately to plain Tylenol or Motrin.  2. Following the banding, avoid strenuous exercise that evening and resume full activity the next day.  A sitz bath (soaking in a warm tub) or bidet is soothing, and can be useful for cleansing the area after bowel movements.     3. To avoid constipation, take two tablespoons of natural wheat bran, natural oat bran, flax, Benefiber or any over the counter fiber supplement and increase your water intake to 7-8 glasses daily.    4. Unless you have been prescribed anorectal medication, do not put anything inside your rectum for two weeks: No suppositories, enemas, fingers, etc.  5. Occasionally, you may have more bleeding than usual after the banding procedure.  This is often from the untreated hemorrhoids rather than the treated one.  Don't be concerned if there is a tablespoon or so of blood.  If there is more blood than this, lie flat with your bottom higher than your head and apply an ice pack to the area. If the bleeding  does not stop within a half an hour or if you feel faint, call our office at [phone] or go to the emergency room.  6. Problems are not common; however, if there is a substantial amount of bleeding, severe pain, chills, fever or difficulty passing urine (very rare) or other problems, you should call us at [phone] or report to the nearest emergency room.  7. Do not stay seated continuously for more than 2-3 hours for a day or two after the procedure.  Tighten your buttock muscles 10-15 times every two hours and take 10-15 deep breaths every 1-2 hours.  Do not spend more than a few minutes on the toilet if you cannot empty your bowel; instead re-visit the toilet at a later time.    It was a pleasure to see you today!   Follow up as needed.    Dr. Havery Moros

## 2018-02-09 NOTE — Progress Notes (Signed)
62 y/o female here for follow up - wid well post initial banding on 12/10/17, tolerated it well (LL banded). She was tried on Trulance and did not help her much at all for her constipation. She also failed Linzess. Miralax tends to work for her although can be too strong at times. We discussed options. She wishes to continue to therapy with banding after a discussion of options. On DRE previously she has normal tone and normal decent, I think pelvic floor dysfunction is unlikely. Since the last visit given Miralax 1/2 cap to one cap daily to prevent constipation. RP hemorrhoid banded on 01/19/2018.    PROCEDURE NOTE: The patient presents with symptomatic grade I  hemorrhoids, requesting rubber band ligation of his/her hemorrhoidal disease.  All risks, benefits and alternative forms of therapy were described and informed consent was obtained.   The anorectum was pre-medicated with 0.125% nitroglycerin The decision was made to band the RA internal hemorrhoid, and the [place] was used to perform band ligation without complication.  Digital anorectal examination was then performed to assure proper positioning of the band, and to adjust the banded tissue as required. The patient was discharged home without pain or other issues.  Dietary and behavioral recommendations were given and along with follow-up instructions.     The following adjunctive treatments were recommended: Continue Miralax as needed  The patient will return as needed for  follow-up and possible additional banding as required. No complications were encountered and the patient tolerated the procedure well.  Hebron Cellar, MD [facility]

## 2018-02-10 ENCOUNTER — Encounter: Payer: Self-pay | Admitting: Physician Assistant

## 2018-02-11 ENCOUNTER — Ambulatory Visit: Payer: No Typology Code available for payment source | Admitting: Obstetrics & Gynecology

## 2018-02-11 ENCOUNTER — Encounter: Payer: Self-pay | Admitting: Obstetrics & Gynecology

## 2018-02-11 VITALS — BP 112/73 | HR 75 | Resp 16 | Ht 63.0 in | Wt 137.0 lb

## 2018-02-11 DIAGNOSIS — Z01419 Encounter for gynecological examination (general) (routine) without abnormal findings: Secondary | ICD-10-CM

## 2018-02-11 MED ORDER — PRASTERONE 6.5 MG VA INST: 1.0000 | VAGINAL_INSERT | Freq: Every day | VAGINAL | 12 refills | Status: AC

## 2018-02-11 MED FILL — INTRAROSA 6.5 MG VAG INSERT: 6.5 | 28 days supply | Qty: 28 | Fill #0

## 2018-02-11 NOTE — Progress Notes (Signed)
Subjective:    Jillian Stewart is a 62 y.o. female who presents for an annual exam. She doesn't have sex much due to vaginal dryness and decreased libido of her husband. She tried compounded vaginal estrogen but it is messy and somewhat smelly. The patient is rarely sexually active. GYN screening history: last pap: was normal. The patient wears seatbelts: yes. The patient participates in regular exercise: yes. Has the patient ever been transfused or tattooed?: no. The patient reports that there is not domestic violence in her life.   Menstrual History: OB History    Gravida  3   Para  3   Term  3   Preterm      AB      Living  3     SAB      TAB      Ectopic      Multiple      Live Births  3           Menarche age: 38 No LMP recorded. Patient is postmenopausal.    The following portions of the patient's history were reviewed and updated as appropriate: allergies, current medications, past family history, past medical history, past social history, past surgical history and problem list.  Review of Systems Pertinent items are noted in HPI.   Married for 42 years Moving to [place] [place]   Objective:    BP 112/73   Pulse 75   Resp 16   Ht 5\' 3"  (1.6 m)   Wt 137 lb (62.1 kg)   BMI 24.27 kg/m   General Appearance:    Alert, cooperative, no distress, appears stated age  Head:    Normocephalic, without obvious abnormality, atraumatic  Eyes:    PERRL, conjunctiva/corneas clear, EOM's intact, fundi    benign, both eyes  Ears:    Normal TM's and external ear canals, both ears  Nose:   Nares normal, septum midline, mucosa normal, no drainage    or sinus tenderness  Throat:   Lips, mucosa, and tongue normal; teeth and gums normal  Neck:   Supple, symmetrical, trachea midline, no adenopathy;    thyroid:  no enlargement/tenderness/nodules; no carotid   bruit or JVD  Back:     Symmetric, no curvature, ROM normal, no CVA tenderness  Lungs:     Clear to auscultation  bilaterally, respirations unlabored  Chest Wall:    No tenderness or deformity   Heart:    Regular rate and rhythm, S1 and S2 normal, no murmur, rub   or gallop  Breast Exam:    No tenderness, masses, or nipple abnormality  Abdomen:     Soft, non-tender, bowel sounds active all four quadrants,    no masses, no organomegaly  Genitalia:    Normal female without lesion, discharge or tenderness, severe vulvovaginal atrophy, no tenderness or masses palpable with bimanual exam     Extremities:   Extremities normal, atraumatic, no cyanosis or edema  Pulses:   2+ and symmetric all extremities  Skin:   Skin color, texture, turgor normal, no rashes or lesions  Lymph nodes:   Cervical, supraclavicular, and axillary nodes normal  Neurologic:   CNII-XII intact, normal strength, sensation and reflexes    throughout  .    Assessment:    Healthy female exam.    Plan:     No more paps needed   Recommend annual exam with pelvic exam Trial of [place]

## 2018-02-15 MED FILL — traMADol HCL 50 MG TABS: 50 | 30 days supply | Qty: 30 | Fill #0

## 2018-02-17 ENCOUNTER — Ambulatory Visit (INDEPENDENT_AMBULATORY_CARE_PROVIDER_SITE_OTHER): Payer: No Typology Code available for payment source

## 2018-02-17 DIAGNOSIS — Z13820 Encounter for screening for osteoporosis: Secondary | ICD-10-CM | POA: Diagnosis not present

## 2018-02-17 DIAGNOSIS — Z1231 Encounter for screening mammogram for malignant neoplasm of breast: Secondary | ICD-10-CM | POA: Diagnosis not present

## 2018-02-19 ENCOUNTER — Telehealth: Payer: Self-pay

## 2018-02-19 NOTE — Telephone Encounter (Signed)
Jillian Stewart called today and was requesting to have either a CT scan or a chest x-ray done to check her lungs. Patient states she is a former smoker and finally quit at age 62, but a lot of her friends are being diagnosed with lung cancer, and she would like to be checked just to be sure. Patient states she has had CT scans done before on her lungs and it showed small dots, but she wanted a more updated one done. Just wanted to let you know. Thanks so much!

## 2018-02-22 NOTE — Progress Notes (Signed)
Normal bone density but continue with vitamin D 1000 units and calcium 1300mg (4 servings of dairy)

## 2018-02-22 NOTE — Telephone Encounter (Signed)
First can we find out if she was denied for the lung cancer screening I ordered in November.

## 2018-02-25 NOTE — Telephone Encounter (Signed)
Called and left a voicemail for patient to call office back to find out what happened. Call back information provided.

## 2018-03-09 MED FILL — traMADol HCL 50 MG TABS: 50 | 30 days supply | Qty: 30 | Fill #0

## 2018-03-25 NOTE — Telephone Encounter (Signed)
Attempted to call patient, but was unable to leave a voicemail. Will attempt to contact patient at a later time.

## 2018-04-08 NOTE — Telephone Encounter (Signed)
Called and left patient another voicemail to please call us back and let us know about the lung screening. Call back information provided.

## 2018-12-08 ENCOUNTER — Other Ambulatory Visit: Payer: Self-pay | Admitting: Physician Assistant

## 2018-12-08 DIAGNOSIS — I10 Essential (primary) hypertension: Secondary | ICD-10-CM

## 2019-03-08 ENCOUNTER — Other Ambulatory Visit: Payer: Self-pay | Admitting: Physician Assistant

## 2019-03-08 DIAGNOSIS — I10 Essential (primary) hypertension: Secondary | ICD-10-CM

## 2019-07-07 IMAGING — MR MR HIP*R* W/O CM
5 of 7 series · 30 of 40 positions shown · non-contrast
Comparison: Right hip x-ray dated June 24, 2017.

CLINICAL DATA: Chronic bilateral hip pain and soreness for the past
year. Popping sound when bending down.

EXAM:
MR OF THE LEFT HIP WITHOUT CONTRAST
MR OF THE RIGHT HIP WITHOUT CONTRAST
TECHNIQUE: Multiplanar, multisequence MR imaging was performed. No intravenous
contrast was administered.

[Series 3: T1 · coronal · 4.0mm · 1.25mm/px · 4 of 28 slices shown (1 of 2)]
[im 1/28]
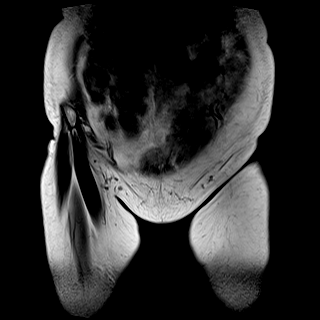
[im 10/28]
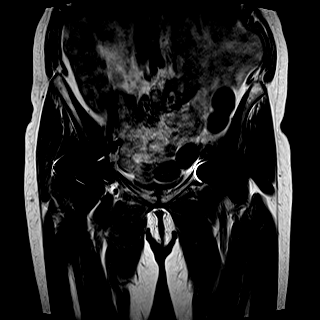
[im 19/28]
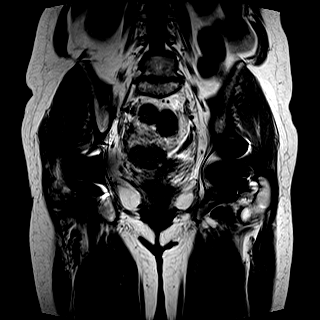
[im 28/28]
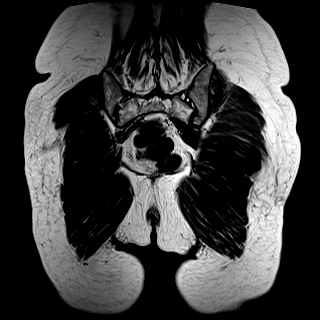

[Series 4: STIR · coronal · 4.0mm · 1.56mm/px · 4 of 28 slices shown (1 of 2)]
[im 1/28]
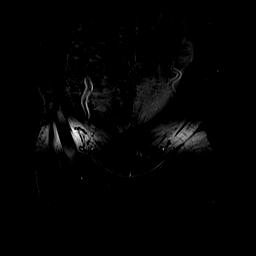
[im 10/28]
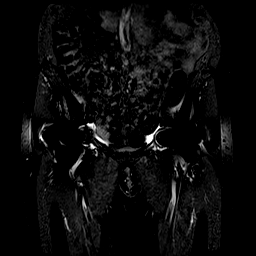
[im 19/28]
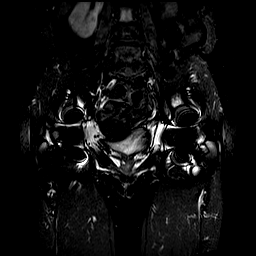
[im 28/28]
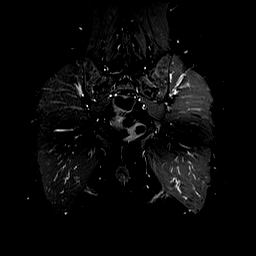

[Series 5: T1 · axial · 4.0mm · 0.74mm/px · z∈[-130,+115]mm · 8 of 51 slices shown (2 of 2)]
[im 1/51]
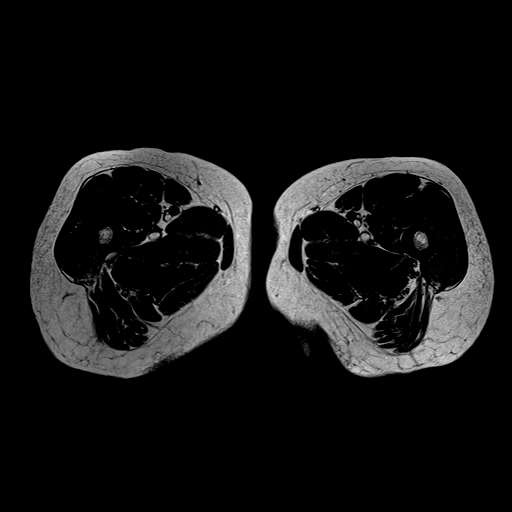
[im 8/51]
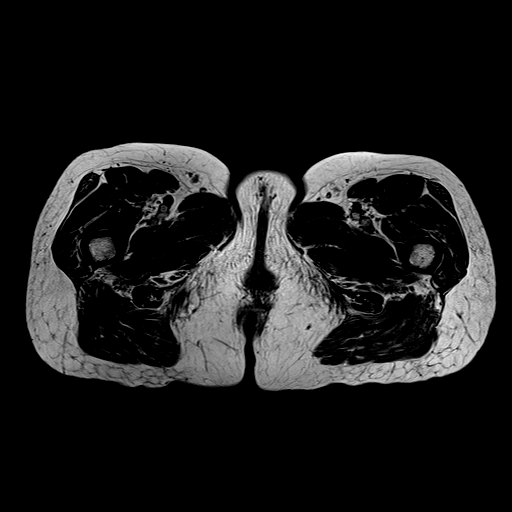
[im 15/51]
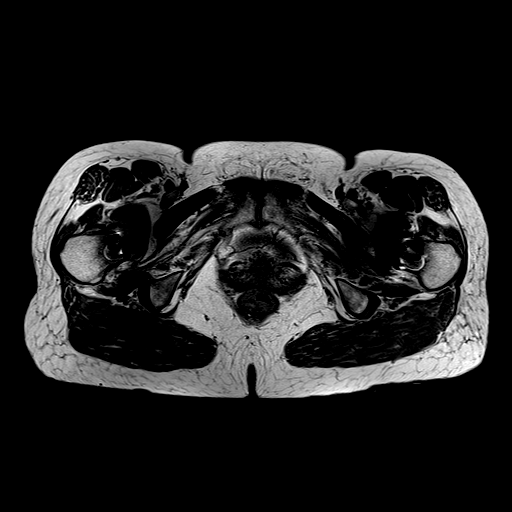
[im 22/51]
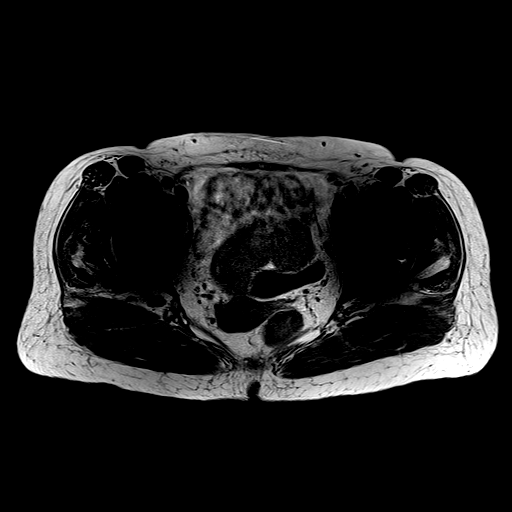
[im 29/51]
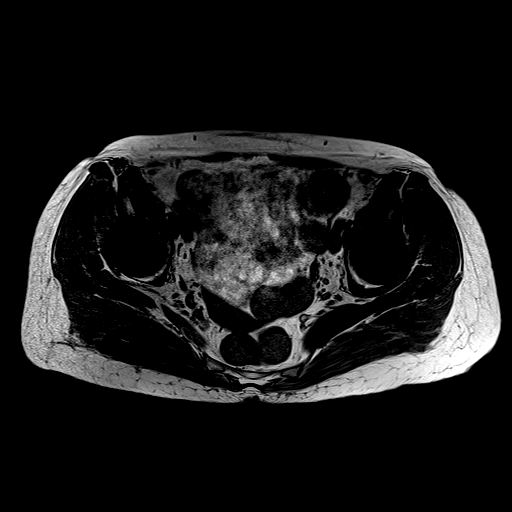
[im 36/51]
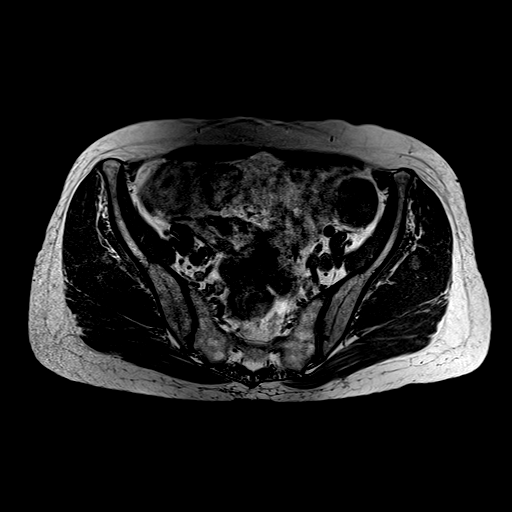
[im 43/51]
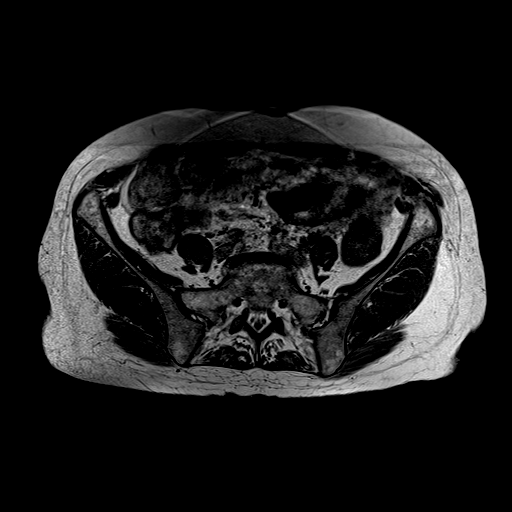
[im 51/51]
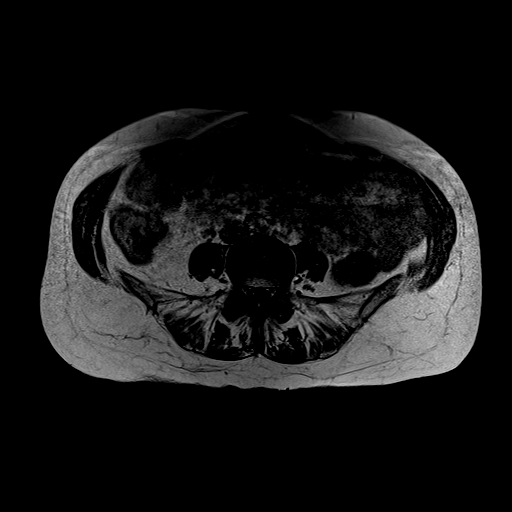

[Series 6: T2 · axial · 4.0mm · 0.99mm/px · z∈[-130,+115]mm · 8 of 52 slices shown]
[im 1/52]
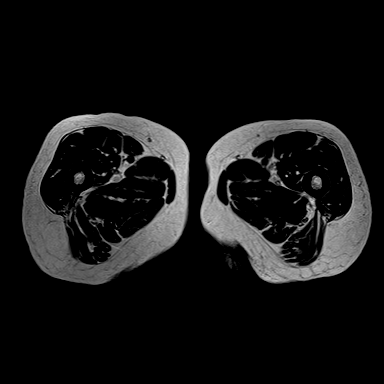
[im 8/52]
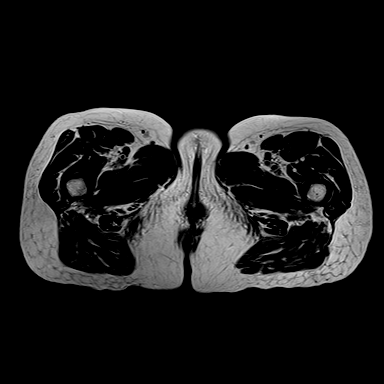
[im 15/52]
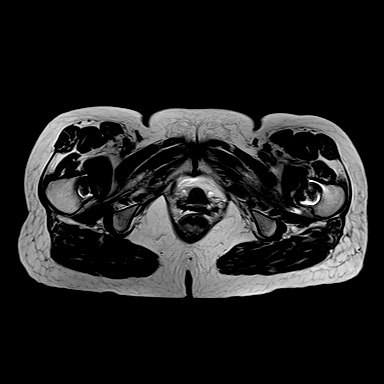
[im 22/52]
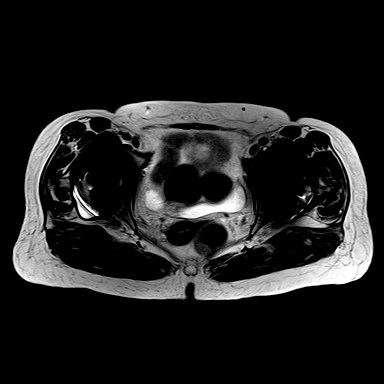
[im 30/52]
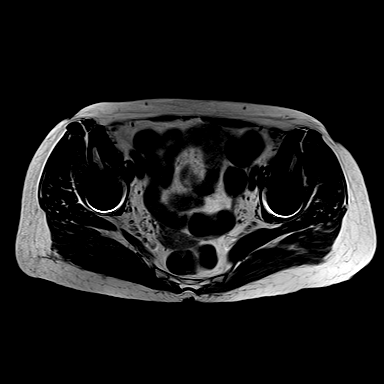
[im 37/52]
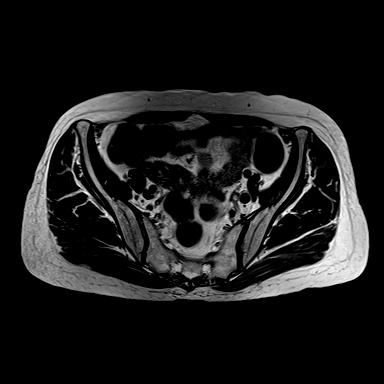
[im 44/52]
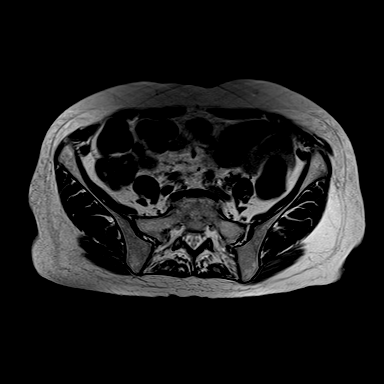
[im 52/52]
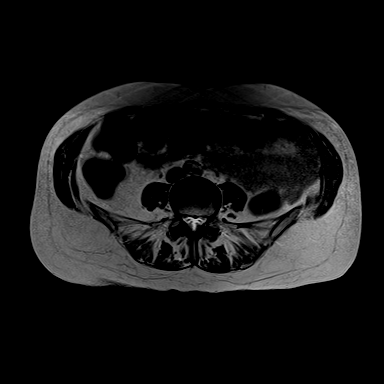

[Series 7: STIR · axial · 4.0mm · 0.99mm/px · z∈[-130,+43]mm · 6 of 52 slices shown (2 of 2)]
[im 1/52]
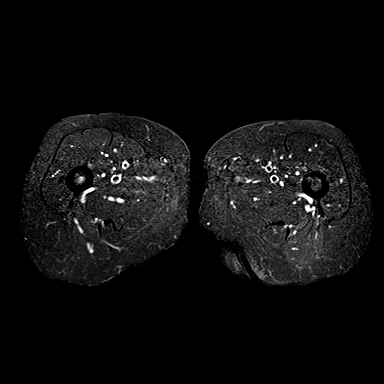
[im 8/52]
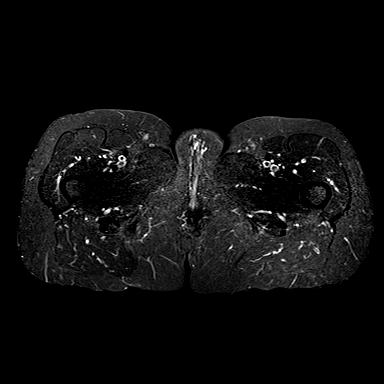
[im 15/52]
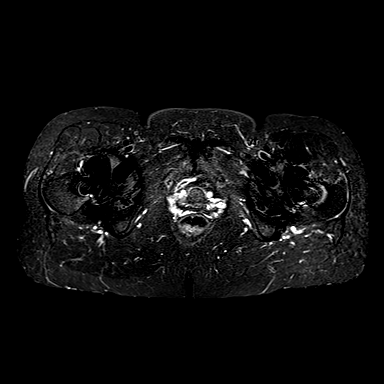
[im 22/52]
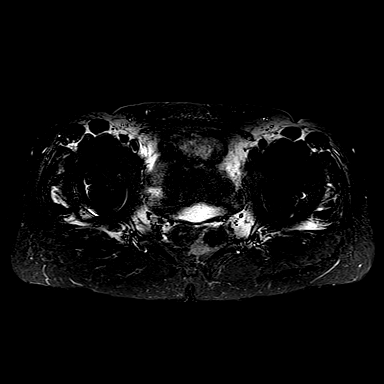
[im 30/52]
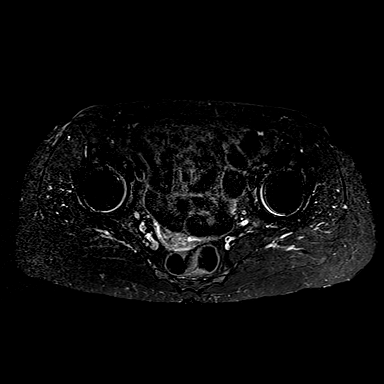
[im 37/52]
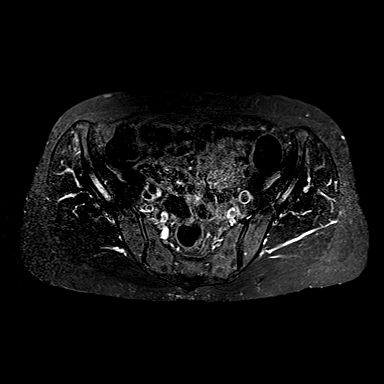

[30 of 40 positions shown; findings below may reference images not displayed]

FINDINGS: Bones: Prior bilateral hip resurfacing arthroplasties.
Susceptibility artifact somewhat limits evaluation despite AMAZ[facility]
protocol. There is no evidence of acute fracture, dislocation or
avascular necrosis. The visualized bony pelvis appears normal. The
visualized sacroiliac joints and symphysis pubis appear normal.
Severe disc height loss at L5-S1.

Joint or bursal effusion

Joint effusion: No significant hip joint effusion.

Bursae: Small amount of fluid in the left greater trochanteric
bursa. Minimal fluid in the left iliopsoas bursa.

Muscles and tendons

Muscles and tendons: The visualized gluteus, hamstring and iliopsoas
tendons appear normal. Mild atrophy of the right piriformis muscle.

Other findings

Miscellaneous: 2.1 cm simple appearing cyst in the right ovary. The
visualized internal pelvic contents appear unremarkable.
IMPRESSION: 1. Mild left greater trochanteric bursitis.
2. Trace fluid in the left iliopsoas bursa.
3. Benign-appearing 2.1 cm cyst in the right ovary. No further
follow-up is required. This recommendation follows ACR consensus
guidelines: White Paper of the ACR Incidental Findings Committee II
on Adnexal Findings. [HOSPITAL] [DATE].

## 2022-09-12 ENCOUNTER — Encounter: Payer: Self-pay | Admitting: Gastroenterology
